# Patient Record
Sex: Female | Born: 1995 | Race: White | Hispanic: No | Marital: Single | Smoking: Never smoker
Health system: Southern US, Community
[De-identification: ages and names within clinical notes are randomized; demographics above are authoritative.]

## PROBLEM LIST (undated history)

## (undated) DIAGNOSIS — N3010 Interstitial cystitis (chronic) without hematuria: Secondary | ICD-10-CM

## (undated) DIAGNOSIS — F909 Attention-deficit hyperactivity disorder, unspecified type: Secondary | ICD-10-CM

## (undated) HISTORY — PX: WISDOM TOOTH EXTRACTION: SHX21

## (undated) HISTORY — DX: Interstitial cystitis (chronic) without hematuria: N30.10

---
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
## 2015-01-16 ENCOUNTER — Ambulatory Visit (INDEPENDENT_AMBULATORY_CARE_PROVIDER_SITE_OTHER): Payer: PRIVATE HEALTH INSURANCE | Admitting: Obstetrics and Gynecology

## 2015-01-16 ENCOUNTER — Encounter: Payer: Self-pay | Admitting: Obstetrics and Gynecology

## 2015-01-16 VITALS — BP 118/62 | HR 64 | Resp 14 | Wt 152.0 lb

## 2015-01-16 DIAGNOSIS — Z30431 Encounter for routine checking of intrauterine contraceptive device: Secondary | ICD-10-CM | POA: Diagnosis not present

## 2015-01-16 NOTE — Progress Notes (Signed)
Patient ID: Dawn Webb, female   DOB: 13-Jul-1995, 19 y.o.   MRN: [id number] GYNECOLOGY  VISIT   HPI: 19 y.o.   Single  Caucasian  female   G0P0 with Patient's last menstrual period was 01/11/2015.   here for 1 month f/u skyla IUD insertion. Patient is doing well with IUD.   Light bleeding and cramping for the first week and a half. She has had a cycle, it was lighter and less crampy than normal. No other concerns.   GYNECOLOGIC HISTORY: Patient's last menstrual period was 01/11/2015. Contraception:IUD Menopausal hormone therapy: N/A        OB History    Gravida Para Term Preterm AB TAB SAB Ectopic Multiple Living   0                  There are no active problems to display for this patient.   History reviewed. No pertinent past medical history.  Past Surgical History  Procedure Laterality Date  . Tonsillectomy  2013    Current Outpatient Prescriptions  Medication Sig Dispense Refill  . Levonorgestrel 13.5 MG IUD 13.5 mg by Intrauterine route once. 1 Intra Uterine Device 0   No current facility-administered medications for this visit.     ALLERGIES: Review of patient's allergies indicates no known allergies.  Family History  Problem Relation Age of Onset  . Lung cancer Maternal Grandfather     worked in [employer]    Social History   Social History  . Marital Status: Single    Spouse Name: N/A  . Number of Children: N/A  . Years of Education: N/A   Occupational History  . Not on file.   Social History Main Topics  . Smoking status: Never Smoker   . Smokeless tobacco: Never Used  . Alcohol Use: No  . Drug Use: No  . Sexual Activity:    Partners: Male    Birth Control/ Protection: Pill, Condom   Other Topics Concern  . Not on file   Social History Narrative    Review of Systems  All other systems reviewed and are negative.   PHYSICAL EXAMINATION:    BP 118/62 mmHg  Pulse 64  Resp 14  Wt 152 lb (68.947 kg)  LMP 01/11/2015    General  appearance: alert, cooperative and appears stated age  Pelvic: External genitalia:  no lesions              Urethra:  normal appearing urethra with no masses, tenderness or lesions              Bartholins and Skenes: normal                 Vagina: normal appearing vagina with normal color and discharge, no lesions              Cervix: no lesions and IUD strings 2 cm.              Bimanual Exam:  Uterus:  normal size, contour, position, consistency, mobility, non-tender and anteverted              Adnexa: no mass, fullness, tenderness             Chaperone was present for exam.  ASSESSMENT IUD check, doing well    PLAN Routine f/u   An After Visit Summary was printed and given to the patient.  ______ minutes face to face time of which over 50% was spent in counseling.

## 2015-02-24 ENCOUNTER — Telehealth: Payer: Self-pay | Admitting: Obstetrics and Gynecology

## 2015-02-24 NOTE — Telephone Encounter (Signed)
Spoke with patient's mother Dawn Webb. Sheryl states that the patient has been having increased urinary frequency and discomfort with urination. [facility] and was told she had a UTI. Completed antibiotics and still has symptoms. [facility] and was advised that she does not have a UTI currently. Does not have any fevers, chills, or lower back pain. Will be in town on 10/28 and would like to see an MD. Advised mother that Dr.Jertson is out of the office on Friday. Appointment scheduled for 10/28 at 1 pm with Dr.Silva. Agreeable to date and time. Advised if patient's symptoms persist or develops fever, chills, or lower back pain she will need to be seen again at school or a local urgent care. Mother is agreeable and will advise patient.  Cc: Dr.Jertson  Routing to provider for final review. Patient agreeable to disposition. Will close encounter.

## 2015-02-24 NOTE — Telephone Encounter (Signed)
Patient mother calling in regards to patient, patient had IUD inserted 12/08/2014. Patient's mother Dawn Webb says ever since patient has had IUD she has been having uti symptoms, [facility] and they have done tests on her and can't find anything wrong. She is still having urinary symptoms and would like patient to be seen by Dr. Oscar LaJertson. Patient's mom sheryl says she will be in town 03/03/15 and would  Like patient to see Dr. Oscar LaJertson. Best contact # for [employer] [phone] (no dpr on file to speak with mom)

## 2015-03-03 ENCOUNTER — Encounter: Payer: Self-pay | Admitting: Obstetrics and Gynecology

## 2015-03-03 ENCOUNTER — Ambulatory Visit (INDEPENDENT_AMBULATORY_CARE_PROVIDER_SITE_OTHER): Payer: PRIVATE HEALTH INSURANCE | Admitting: Obstetrics and Gynecology

## 2015-03-03 VITALS — BP 126/70 | HR 60 | Resp 14 | Wt 145.0 lb

## 2015-03-03 DIAGNOSIS — N941 Unspecified dyspareunia: Secondary | ICD-10-CM

## 2015-03-03 DIAGNOSIS — R300 Dysuria: Secondary | ICD-10-CM | POA: Diagnosis not present

## 2015-03-03 DIAGNOSIS — R8290 Unspecified abnormal findings in urine: Secondary | ICD-10-CM

## 2015-03-03 LAB — POCT URINALYSIS DIPSTICK
Bilirubin, UA: NEGATIVE
Glucose, UA: NEGATIVE
Ketones, UA: NEGATIVE
Nitrite, UA: NEGATIVE
PH UA: 6.5
Protein, UA: NEGATIVE
RBC UA: NEGATIVE
UROBILINOGEN UA: NEGATIVE

## 2015-03-03 MED ORDER — NITROFURANTOIN MONOHYD MACRO 100 MG PO CAPS: 100.0000 mg | ORAL_CAPSULE | Freq: Two times a day (BID) | ORAL | Status: DC

## 2015-03-03 NOTE — Progress Notes (Signed)
Patient ID: Dawn DressSarah L Webb, female   DOB: [birth year], 19 y.o.   MRN: [id number] GYNECOLOGY  VISIT   HPI: 19 y.o.   Single  Caucasian  female   G0P0 with Patient's last menstrual period was 02/06/2015.   here c/o dysuria, urinary frequency and urgency. She just finished 7 days of doxycyline.  Almost feels like she is leaking urine.   Symptoms onset 3 weeks ago.  [facility].  Received Rx for doxycycline and prior to that, Ciprofloxacin.  Pain improves with voiding.  Doxycycline has helped the pain.  All culture have been negative to date.   UTIs in September and October, and then last April.   Having pain with intercourse.  Has bladder symptoms onset after intercourse.   Skyla IUD placed 12/08/14. [facility]  Had negative GC/CT in June 2016.   Urine - trace WBCs.   GYNECOLOGIC HISTORY: Patient's last menstrual period was 02/06/2015. Contraception:IUD Menopausal hormone therapy: N/A Last mammogram: N/A Last pap smear: N/A        OB History    Gravida Para Term Preterm AB TAB SAB Ectopic Multiple Living   0                  There are no active problems to display for this patient.   No past medical history on file.  Past Surgical History  Procedure Laterality Date  . Tonsillectomy  2013    Current Outpatient Prescriptions  Medication Sig Dispense Refill  . Levonorgestrel 13.5 MG IUD 13.5 mg by Intrauterine route once. 1 Intra Uterine Device 0   No current facility-administered medications for this visit.     ALLERGIES: Review of patient's allergies indicates no known allergies.  Family History  Problem Relation Age of Onset  . Lung cancer Maternal Grandfather     worked in [employer]    Social History   Social History  . Marital Status: Single    Spouse Name: N/A  . Number of Children: N/A  . Years of Education: N/A   Occupational History  . Not on file.   Social History Main Topics  . Smoking status: Never Smoker   .  Smokeless tobacco: Never Used  . Alcohol Use: No  . Drug Use: No  . Sexual Activity:    Partners: Male    Birth Control/ Protection: Pill, Condom   Other Topics Concern  . Not on file   Social History Narrative    ROS:  Pertinent items are noted in HPI.  PHYSICAL EXAMINATION:    BP 126/70 mmHg  Pulse 60  Resp 14  Wt 145 lb (65.772 kg)  LMP 02/06/2015    General appearance: alert, cooperative and appears stated age   Pelvic: External genitalia:  no lesions              Urethra:  normal appearing urethra with no masses, tenderness or lesions              Bartholins and Skenes: normal                 Vagina: normal appearing vagina with normal color and discharge, no lesions              Cervix: no lesions and IUD strings seen.  No CMT.             Bimanual Exam:  Uterus:  normal size, contour, position, consistency, mobility, non-tender  Adnexa: normal adnexa and no mass, fullness, tenderness             Chaperone was present for exam.  ASSESSMENT  Skyla IUD patient.  Dysuria.    Negative urine cultures. Status post doxycycline 100 mg pot bid for 1 week. No evidence of PID.  PLAN  Will do murine micro and culture.  GC/CT taken today.  Affirm testing.  Will treat with Macrobid 100 mg po bid for 7 days.  Pyridium (AZO standard) 200 mg pot tid for 48 hours.  If pain persists, would consider ultrasound and possible urology referral.  An After Visit Summary was printed and given to the patient.  ___15___ minutes face to face time of which over 50% was spent in counseling.

## 2015-03-04 LAB — WET PREP BY MOLECULAR PROBE
Candida species: NEGATIVE
Gardnerella vaginalis: NEGATIVE
Trichomonas vaginosis: NEGATIVE

## 2015-03-04 LAB — URINALYSIS, MICROSCOPIC ONLY
CRYSTALS: NONE SEEN [HPF]
Casts: NONE SEEN [LPF]
Yeast: NONE SEEN [HPF]

## 2015-03-04 LAB — URINE CULTURE
COLONY COUNT: NO GROWTH
Organism ID, Bacteria: NO GROWTH

## 2015-03-07 LAB — IPS N GONORRHOEA AND CHLAMYDIA BY PCR

## 2015-03-10 ENCOUNTER — Telehealth: Payer: Self-pay | Admitting: Obstetrics and Gynecology

## 2015-03-10 NOTE — Telephone Encounter (Signed)
I do not personally know of one.  There are many medical groups [facility] including the [facility] who also have satellite clinics in [place].   If she wants to see someone in [place], I would refer her to [facility].   Cc- Dr. Oscar LaJertson

## 2015-03-10 NOTE — Telephone Encounter (Signed)
Routing to Dr. Edward JollySilva for possible recommendations.

## 2015-03-10 NOTE — Telephone Encounter (Signed)
Call back to patient. She is given message from Dr. Edward JollySilva. She is going to research options in the area and return call if she needs a referral from her insurance company. [place] at this time.  Routing to provider for final review. Patient agreeable to disposition. Will close encounter.

## 2015-03-10 NOTE — Telephone Encounter (Signed)
Patient says Dr. Edward JollySilva recommended for her to see a urologist patient wanted to know if Dr. Edward JollySilva knew of one in [place]. Best # to reach [phone]

## 2015-07-24 ENCOUNTER — Telehealth: Payer: Self-pay | Admitting: Obstetrics and Gynecology

## 2015-07-24 DIAGNOSIS — Z30432 Encounter for removal of intrauterine contraceptive device: Secondary | ICD-10-CM

## 2015-07-24 NOTE — Telephone Encounter (Signed)
Patient would like to have her iud removed and to discuss getting on birth control pills.

## 2015-07-24 NOTE — Telephone Encounter (Signed)
Spoke with patient. She would like to schedule an IUD removal at this time. Patient would like to discuss other forms of birth control as well. Appointment scheduled for 07/31/2015 at 9 am with Dr.Jertson. She is agreeable to date and time. Order placed for precert.  Routing to provider for final review. Patient agreeable to disposition. Will close encounter.

## 2015-07-28 ENCOUNTER — Telehealth: Payer: Self-pay | Admitting: Obstetrics and Gynecology

## 2015-07-28 NOTE — Telephone Encounter (Signed)
Spoke with patient. Correct information obtained. Ok to close message.

## 2015-07-28 NOTE — Telephone Encounter (Signed)
Called patient to review benefits for a recommended procedure, the insurance we have on file has terminated as of 04/05/15. We will need to insurance information in order to verify/pre-cert benefits. Left Voicemail requesting a call back

## 2015-07-31 ENCOUNTER — Encounter: Payer: Self-pay | Admitting: Obstetrics and Gynecology

## 2015-07-31 ENCOUNTER — Ambulatory Visit (INDEPENDENT_AMBULATORY_CARE_PROVIDER_SITE_OTHER): Payer: No Typology Code available for payment source | Admitting: Obstetrics and Gynecology

## 2015-07-31 VITALS — BP 114/60 | HR 64 | Resp 14 | Wt 153.0 lb

## 2015-07-31 DIAGNOSIS — N946 Dysmenorrhea, unspecified: Secondary | ICD-10-CM

## 2015-07-31 DIAGNOSIS — Z3009 Encounter for other general counseling and advice on contraception: Secondary | ICD-10-CM

## 2015-07-31 DIAGNOSIS — L709 Acne, unspecified: Secondary | ICD-10-CM

## 2015-07-31 DIAGNOSIS — Z30432 Encounter for removal of intrauterine contraceptive device: Secondary | ICD-10-CM | POA: Diagnosis not present

## 2015-07-31 MED ORDER — ETONOGESTREL-ETHINYL ESTRADIOL 0.12-0.015 MG/24HR VA RING: VAGINAL_RING | VAGINAL | Status: DC

## 2015-07-31 NOTE — Patient Instructions (Signed)

## 2015-07-31 NOTE — Progress Notes (Signed)
Patient ID: Dawn Webb, female   DOB: [birth year], 20 y.o.   MRN: [id number] GYNECOLOGY  VISIT   HPI: 20 y.o.   Single  Caucasian  female   G0P0 with Patient's last menstrual period was 07/09/2015.   here to have her IUD removed and to discuss other birth control options. She has severe cramps with her cycle and her acne has got worse since having the Skyla IUD. She is having monthly cycles, lighter than prior to the IUD. Severe cramps for one day, less severe for the rest of the week. She has started getting some pre-menstrual cramping.  She was recently seen by a urologist and was diagnosed with interstitial cystitis.  She was on OCP's in the past. No contraindications. She would like to not have to take a pill every day. She is currently sexually active.   GYNECOLOGIC HISTORY: Patient's last menstrual period was 07/09/2015. Contraception:IUD Menopausal hormone therapy: none         OB History    Gravida Para Term Preterm AB TAB SAB Ectopic Multiple Living   0                  There are no active problems to display for this patient.   History reviewed. No pertinent past medical history.  Past Surgical History  Procedure Laterality Date  . Tonsillectomy  2013    Current Outpatient Prescriptions  Medication Sig Dispense Refill  . Levonorgestrel 13.5 MG IUD 13.5 mg by Intrauterine route once. 1 Intra Uterine Device 0   No current facility-administered medications for this visit.     ALLERGIES: Review of patient's allergies indicates no known allergies.  Family History  Problem Relation Age of Onset  . Lung cancer Maternal Grandfather     worked in [employer]  . Heart attack Father     Social History   Social History  . Marital Status: Single    Spouse Name: N/A  . Number of Children: N/A  . Years of Education: N/A   Occupational History  . Not on file.   Social History Main Topics  . Smoking status: Never Smoker   . Smokeless tobacco: Never Used  . Alcohol  Use: No  . Drug Use: No  . Sexual Activity:    Partners: Male    Birth Control/ Protection: Pill, Condom   Other Topics Concern  . Not on file   Social History Narrative    Review of Systems  Constitutional: Negative.   HENT: Negative.   Eyes: Negative.   Respiratory: Negative.   Cardiovascular: Negative.   Gastrointestinal: Negative.   Genitourinary: Negative.   Musculoskeletal: Negative.   Skin: Negative.   Neurological: Negative.   Endo/Heme/Allergies: Negative.   Psychiatric/Behavioral: Negative.     PHYSICAL EXAMINATION:    BP 114/60 mmHg  Pulse 64  Resp 14  Wt 153 lb (69.4 kg)  LMP 07/09/2015    General appearance: alert, cooperative and appears stated age Skin: moderate acne noted on her face Pelvic: External genitalia:  no lesions              Urethra:  normal appearing urethra with no masses, tenderness or lesions              Bartholins and Skenes: normal                 Vagina: normal appearing vagina with normal color and discharge, no lesions  Cervix: without lesions, IUD string 2 cm   The IUD was removed with ringed forceps without difficulty  Chaperone was present for exam.  ASSESSMENT Acne and increased cramping with the skyla IUD, desires removal Contraception management Dysmenorrhea    PLAN IUD removed Start the nuvaring, risks reviewed, no contraindications She will start the nuvaring today, aware of abnormal bleeding, use condoms  Acne was better on OCP's in the past, likely will improve with the nuvaring F/U in 7/17 for her annual with Mrs Berneice GandyGrubb She is taking advil for cramps, discussed the option of Anaprox, she will call if she wants it   An After Visit Summary was printed and given to the patient.

## 2015-11-10 ENCOUNTER — Ambulatory Visit: Payer: PRIVATE HEALTH INSURANCE | Admitting: Nurse Practitioner

## 2015-12-15 ENCOUNTER — Encounter: Payer: Self-pay | Admitting: Certified Nurse Midwife

## 2015-12-15 ENCOUNTER — Ambulatory Visit (INDEPENDENT_AMBULATORY_CARE_PROVIDER_SITE_OTHER): Payer: No Typology Code available for payment source | Admitting: Certified Nurse Midwife

## 2015-12-15 VITALS — BP 100/64 | HR 72 | Resp 16 | Ht 64.25 in | Wt 143.0 lb

## 2015-12-15 DIAGNOSIS — Z0000 Encounter for general adult medical examination without abnormal findings: Secondary | ICD-10-CM | POA: Diagnosis not present

## 2015-12-15 DIAGNOSIS — Z01419 Encounter for gynecological examination (general) (routine) without abnormal findings: Secondary | ICD-10-CM | POA: Diagnosis not present

## 2015-12-15 DIAGNOSIS — Z3040 Encounter for surveillance of contraceptives, unspecified: Secondary | ICD-10-CM

## 2015-12-15 MED ORDER — ETONOGESTREL-ETHINYL ESTRADIOL 0.12-0.015 MG/24HR VA RING: VAGINAL_RING | VAGINAL | 4 refills | Status: DC

## 2015-12-15 NOTE — Patient Instructions (Signed)
General topics  Next pap or exam is  due in 1 year Take a Women's multivitamin Take 1200 mg. of calcium daily - prefer dietary If any concerns in interim to call back  Breast Self-Awareness Practicing breast self-awareness may pick up problems early, prevent significant medical complications, and possibly save your life. By practicing breast self-awareness, you can become familiar with how your breasts look and feel and if your breasts are changing. This allows you to notice changes early. It can also offer you some reassurance that your breast health is good. One way to learn what is normal for your breasts and whether your breasts are changing is to do a breast self-exam. If you find a lump or something that was not present in the past, it is best to contact your caregiver right away. Other findings that should be evaluated by your caregiver include nipple discharge, especially if it is bloody; skin changes or reddening; areas where the skin seems to be pulled in (retracted); or new lumps and bumps. Breast pain is seldom associated with cancer (malignancy), but should also be evaluated by a caregiver. BREAST SELF-EXAM The best time to examine your breasts is 5 7 days after your menstrual period is over.  ExitCare Patient Information 2013 [employer].   Exercise to Stay Healthy Exercise helps you become and stay healthy. EXERCISE IDEAS AND TIPS Choose exercises that:  You enjoy.  Fit into your day. You do not need to exercise really hard to be healthy. You can do exercises at a slow or medium level and stay healthy. You can:  Stretch before and after working out.  Try yoga, Pilates, or tai chi.  Lift weights.  Walk fast, swim, jog, run, climb stairs, bicycle, dance, or rollerskate.  Take aerobic classes. Exercises that burn about 150 calories:  Running 1  miles in 15 minutes.  Playing volleyball for 45 to 60 minutes.  Washing and waxing a car for 45 to 60  minutes.  Playing touch football for 45 minutes.  Walking 1  miles in 35 minutes.  Pushing a stroller 1  miles in 30 minutes.  Playing basketball for 30 minutes.  Raking leaves for 30 minutes.  Bicycling 5 miles in 30 minutes.  Walking 2 miles in 30 minutes.  Dancing for 30 minutes.  Shoveling snow for 15 minutes.  Swimming laps for 20 minutes.  Walking up stairs for 15 minutes.  Bicycling 4 miles in 15 minutes.  Gardening for 30 to 45 minutes.  Jumping rope for 15 minutes.  Washing windows or floors for 45 to 60 minutes. Document Released: 05/25/2010 Document Revised: 07/15/2011 Document Reviewed: 05/25/2010 ExitCare Patient Information 2013 [employer].   Other topics ( that may be useful information):    Sexually Transmitted Disease Sexually transmitted disease (STD) refers to any infection that is passed from person to person during sexual activity. This may happen by way of saliva, semen, blood, vaginal mucus, or urine. Common STDs include:  Gonorrhea.  Chlamydia.  Syphilis.  HIV/AIDS.  Genital herpes.  Hepatitis B and C.  Trichomonas.  Human papillomavirus (HPV).  Pubic lice. CAUSES  An STD may be spread by bacteria, virus, or parasite. A person can get an STD by:  Sexual intercourse with an infected person.  Sharing sex toys with an infected person.  Sharing needles with an infected person.  Having intimate contact with the genitals, mouth, or rectal areas of an infected person. SYMPTOMS  Some people may not have any symptoms, but   they can still pass the infection to others. Different STDs have different symptoms. Symptoms include:  Painful or bloody urination.  Pain in the pelvis, abdomen, vagina, anus, throat, or eyes.  Skin rash, itching, irritation, growths, or sores (lesions). These usually occur in the genital or anal area.  Abnormal vaginal discharge.  Penile discharge in men.  Soft, flesh-colored skin growths in the  genital or anal area.  Fever.  Pain or bleeding during sexual intercourse.  Swollen glands in the groin area.  Yellow skin and eyes (jaundice). This is seen with hepatitis. DIAGNOSIS  To make a diagnosis, your caregiver may:  Take a medical history.  Perform a physical exam.  Take a specimen (culture) to be examined.  Examine a sample of discharge under a microscope.  Perform blood test TREATMENT   Chlamydia, gonorrhea, trichomonas, and syphilis can be cured with antibiotic medicine.  Genital herpes, hepatitis, and HIV can be treated, but not cured, with prescribed medicines. The medicines will lessen the symptoms.  Genital warts from HPV can be treated with medicine or by freezing, burning (electrocautery), or surgery. Warts may come back.  HPV is a virus and cannot be cured with medicine or surgery.However, abnormal areas may be followed very closely by your caregiver and may be removed from the cervix, vagina, or vulva through office procedures or surgery. If your diagnosis is confirmed, your recent sexual partners need treatment. This is true even if they are symptom-free or have a negative culture or evaluation. They should not have sex until their caregiver says it is okay. HOME CARE INSTRUCTIONS  All sexual partners should be informed, tested, and treated for all STDs.  Take your antibiotics as directed. Finish them even if you start to feel better.  Only take over-the-counter or prescription medicines for pain, discomfort, or fever as directed by your caregiver.  Rest.  Eat a balanced diet and drink enough fluids to keep your urine clear or pale yellow.  Do not have sex until treatment is completed and you have followed up with your caregiver. STDs should be checked after treatment.  Keep all follow-up appointments, Pap tests, and blood tests as directed by your caregiver.  Only use latex condoms and water-soluble lubricants during sexual activity. Do not use  petroleum jelly or oils.  Avoid alcohol and illegal drugs.  Get vaccinated for HPV and hepatitis. If you have not received these vaccines in the past, talk to your caregiver about whether one or both might be right for you.  Avoid risky sex practices that can break the skin. The only way to avoid getting an STD is to avoid all sexual activity.Latex condoms and dental dams (for oral sex) will help lessen the risk of getting an STD, but will not completely eliminate the risk. SEEK MEDICAL CARE IF:   You have a fever.  You have any new or worsening symptoms. Document Released: 07/13/2002 Document Revised: 07/15/2011 Document Reviewed: 07/20/2010 [facility] Patient Information 2013 [employer].    Domestic Abuse You are being battered or abused if someone close to you hits, pushes, or physically hurts you in any way. You also are being abused if you are forced into activities. You are being sexually abused if you are forced to have sexual contact of any kind. You are being emotionally abused if you are made to feel worthless or if you are constantly threatened. It is important to remember that help is available. No one has the right to abuse you. PREVENTION OF FURTHER  ABUSE  Learn the warning signs of danger. This varies with situations but may include: the use of alcohol, threats, isolation from friends and family, or forced sexual contact. Leave if you feel that violence is going to occur.  If you are attacked or beaten, report it to the police so the abuse is documented. You do not have to press charges. The police can protect you while you or the attackers are leaving. Get the officer's name and badge number and a copy of the report.  Find someone you can trust and tell them what is happening to you: your caregiver, a nurse, clergy member, close friend or family member. Feeling ashamed is natural, but remember that you have done nothing wrong. No one deserves abuse. Document Released:  04/19/2000 Document Revised: 07/15/2011 Document Reviewed: 06/28/2010 ExitCare Patient Information 2013 [employer].    How Much is Too Much Alcohol? Drinking too much alcohol can cause injury, accidents, and health problems. These types of problems can include:   Car crashes.  Falls.  Family fighting (domestic violence).  Drowning.  Fights.  Injuries.  Burns.  Damage to certain organs.  Having a baby with birth defects. ONE DRINK CAN BE TOO MUCH WHEN YOU ARE:  Working.  Pregnant or breastfeeding.  Taking medicines. Ask your doctor.  Driving or planning to drive. If you or someone you know has a drinking problem, get help from a doctor.  Document Released: 02/16/2009 Document Revised: 07/15/2011 Document Reviewed: 02/16/2009 ExitCare Patient Information 2013 [employer].   Smoking Hazards Smoking cigarettes is extremely bad for your health. Tobacco smoke has over 200 known poisons in it. There are over 60 chemicals in tobacco smoke that cause cancer. Some of the chemicals found in cigarette smoke include:   Cyanide.  Benzene.  Formaldehyde.  Methanol (wood alcohol).  Acetylene (fuel used in welding torches).  Ammonia. Cigarette smoke also contains the poisonous gases nitrogen oxide and carbon monoxide.  Cigarette smokers have an increased risk of many serious medical problems and Smoking causes approximately:  90% of all lung cancer deaths in men.  80% of all lung cancer deaths in women.  90% of deaths from chronic obstructive lung disease. Compared with nonsmokers, smoking increases the risk of:  Coronary heart disease by 2 to 4 times.  Stroke by 2 to 4 times.  Men developing lung cancer by 23 times.  Women developing lung cancer by 13 times.  Dying from chronic obstructive lung diseases by 12 times.  . Smoking is the most preventable cause of death and disease in our society.  WHY IS SMOKING ADDICTIVE?  Nicotine is the chemical  agent in tobacco that is capable of causing addiction or dependence.  When you smoke and inhale, nicotine is absorbed rapidly into the bloodstream through your lungs. Nicotine absorbed through the lungs is capable of creating a powerful addiction. Both inhaled and non-inhaled nicotine may be addictive.  Addiction studies of cigarettes and spit tobacco show that addiction to nicotine occurs mainly during the teen years, when young people begin using tobacco products. WHAT ARE THE BENEFITS OF QUITTING?  There are many health benefits to quitting smoking.   Likelihood of developing cancer and heart disease decreases. Health improvements are seen almost immediately.  Blood pressure, pulse rate, and breathing patterns start returning to normal soon after quitting. QUITTING SMOKING   American Lung Association - 1-800-LUNGUSA  American Cancer Society - 1-800-ACS-2345 Document Released: 05/30/2004 Document Revised: 07/15/2011 Document Reviewed: 02/01/2009 ExitCare Patient Information 2013 ExitCare,   LLC.   Stress Management Stress is a state of physical or mental tension that often results from changes in your life or normal routine. Some common causes of stress are:  Death of a loved one.  Injuries or severe illnesses.  Getting fired or changing jobs.  Moving into a new home. Other causes may be:  Sexual problems.  Business or financial losses.  Taking on a large debt.  Regular conflict with someone at home or at work.  Constant tiredness from lack of sleep. It is not just bad things that are stressful. It may be stressful to:  Win the lottery.  Get married.  Buy a new car. The amount of stress that can be easily tolerated varies from person to person. Changes generally cause stress, regardless of the types of change. Too much stress can affect your health. It may lead to physical or emotional problems. Too little stress (boredom) may also become stressful. SUGGESTIONS TO  REDUCE STRESS:  Talk things over with your family and friends. It often is helpful to share your concerns and worries. If you feel your problem is serious, you may want to get help from a professional counselor.  Consider your problems one at a time instead of lumping them all together. Trying to take care of everything at once may seem impossible. List all the things you need to do and then start with the most important one. Set a goal to accomplish 2 or 3 things each day. If you expect to do too many in a single day you will naturally fail, causing you to feel even more stressed.  Do not use alcohol or drugs to relieve stress. Although you may feel better for a short time, they do not remove the problems that caused the stress. They can also be habit forming.  Exercise regularly - at least 3 times per week. Physical exercise can help to relieve that "uptight" feeling and will relax you.  The shortest distance between despair and hope is often a good night's sleep.  Go to bed and get up on time allowing yourself time for appointments without being rushed.  Take a short "time-out" period from any stressful situation that occurs during the day. Close your eyes and take some deep breaths. Starting with the muscles in your face, tense them, hold it for a few seconds, then relax. Repeat this with the muscles in your neck, shoulders, hand, stomach, back and legs.  Take good care of yourself. Eat a balanced diet and get plenty of rest.  Schedule time for having fun. Take a break from your daily routine to relax. HOME CARE INSTRUCTIONS   Call if you feel overwhelmed by your problems and feel you can no longer manage them on your own.  Return immediately if you feel like hurting yourself or someone else. Document Released: 10/16/2000 Document Revised: 07/15/2011 Document Reviewed: 06/08/2007 ExitCare Patient Information 2013 ExitCare[employer].   

## 2015-12-15 NOTE — Progress Notes (Signed)
20 y.o. G0P0 Single  Caucasian Fe here for annual exam. Periods normal. Nuvaring working well. Partner change, desires STD screening. Diagnosed IC controlling with diet.Worked all summer, now going back to school at [employer]! No other health concern today.    Patient's last menstrual period was 09/26/2015.          Sexually active: Yes.     The current method of family planning is NuvaRing vaginal inserts.    Exercising: Yes.    running & weights Smoker:  no  Health Maintenance: Pap:  none MMG:  none Colonoscopy:  none BMD:   none TDaP:  Prior to college Shingles: no Pneumonia: no Hep C and HIV: HIV neg 2016 Labs: none Self breast exam: done occ   reports that she has never smoked. She has never used smokeless tobacco. She reports that she does not drink alcohol or use drugs.  History reviewed. No pertinent past medical history.  Past Surgical History:  Procedure Laterality Date  . TONSILLECTOMY  2013    Current Outpatient Prescriptions  Medication Sig Dispense Refill  . etonogestrel-ethinyl estradiol (NUVARING) 0.12-0.015 MG/24HR vaginal ring Insert vaginally and leave in place for 3 consecutive weeks, then remove for 1 week. 3 each 1   No current facility-administered medications for this visit.     Family History  Problem Relation Age of Onset  . Lung cancer Maternal Grandfather     worked in [employer]  . Heart attack Father     ROS:  Pertinent items are noted in HPI.  Otherwise, a comprehensive ROS was negative.  Exam:   BP 100/64   Pulse 72   Resp 16   Ht 5' 4.25" (1.632 m)   Wt 143 lb (64.9 kg)   LMP 09/26/2015 Comment: used continuously (nuvaring)  BMI 24.36 kg/m  Height: 5' 4.25" (163.2 cm) Ht Readings from Last 3 Encounters:  12/15/15 5' 4.25" (1.632 m)  11/03/14  (1.626 m) (46 %, Z= -0.11)*   * Growth percentiles are based on CDC 2-20 Years data.    General appearance: alert, cooperative and appears stated age Head: Normocephalic, without  obvious abnormality, atraumatic Neck: no adenopathy, supple, symmetrical, trachea midline and thyroid normal to inspection and palpation Lungs: clear to auscultation bilaterally Breasts: normal appearance, no masses or tenderness, No nipple retraction or dimpling, No nipple discharge or bleeding, No axillary or supraclavicular adenopathy Heart: regular rate and rhythm Abdomen: soft, non-tender; no masses,  no organomegaly Extremities: extremities normal, atraumatic, no cyanosis or edema Skin: Skin color, texture, turgor normal. No rashes or lesions Lymph nodes: Cervical, supraclavicular, and axillary nodes normal. No abnormal inguinal nodes palpated Neurologic: Grossly normal   Pelvic: External genitalia:  no lesions              Urethra:  normal appearing urethra with no masses, tenderness or lesions              Bartholin's and Skene's: normal                 Vagina: normal appearing vagina with normal color and discharge, no lesions              Cervix: no cervical motion tenderness, no lesions and nulliparous appearance              Pap taken: No. Bimanual Exam:  Uterus:  normal size, contour, position, consistency, mobility, non-tender              Adnexa: normal adnexa  and no mass, fullness, tenderness               Rectovaginal: Confirms               Anus:  Normal appearance  Chaperone present: yes  A:  Well Woman with normal exam  Contraception Nuvaring desired  New diagnosis of IC, treating with diet with good results, Urology management  STD screening  P:   Reviewed health and wellness pertinent to exam  Rx Nuraring see order with instructions Risks and benefits given, warning signs reviewed and need to advise if occurs.  Continue follow up as indicated  Lab: GC,chlamydia, Affirm only  Pap smear as above at age 20   counseled on breast self exam, STD prevention, HIV risk factors and prevention, adequate intake of calcium and vitamin D, diet and exercise  return  annually or prn  An After Visit Summary was printed and given to the patient.

## 2015-12-16 LAB — WET PREP BY MOLECULAR PROBE
CANDIDA SPECIES: NEGATIVE
Gardnerella vaginalis: NEGATIVE
TRICHOMONAS VAG: NEGATIVE

## 2015-12-19 LAB — IPS N GONORRHOEA AND CHLAMYDIA BY PCR

## 2015-12-21 NOTE — Progress Notes (Signed)
Encounter reviewed Jill Jertson, MD   

## 2016-12-17 ENCOUNTER — Ambulatory Visit: Payer: No Typology Code available for payment source | Admitting: Certified Nurse Midwife

## 2016-12-18 ENCOUNTER — Ambulatory Visit (INDEPENDENT_AMBULATORY_CARE_PROVIDER_SITE_OTHER): Payer: No Typology Code available for payment source | Admitting: Obstetrics and Gynecology

## 2016-12-18 ENCOUNTER — Other Ambulatory Visit (HOSPITAL_COMMUNITY)
Admission: RE | Admit: 2016-12-18 | Discharge: 2016-12-18 | Disposition: A | Discharge status: Home / Self Care | Payer: No Typology Code available for payment source | Source: Ambulatory Visit | Attending: Obstetrics and Gynecology | Admitting: Obstetrics and Gynecology

## 2016-12-18 ENCOUNTER — Encounter: Payer: Self-pay | Admitting: Obstetrics and Gynecology

## 2016-12-18 VITALS — BP 108/62 | HR 88 | Resp 16 | Ht 63.5 in | Wt 158.0 lb

## 2016-12-18 DIAGNOSIS — Z3044 Encounter for surveillance of vaginal ring hormonal contraceptive device: Secondary | ICD-10-CM | POA: Diagnosis not present

## 2016-12-18 DIAGNOSIS — Z113 Encounter for screening for infections with a predominantly sexual mode of transmission: Secondary | ICD-10-CM

## 2016-12-18 DIAGNOSIS — Z124 Encounter for screening for malignant neoplasm of cervix: Secondary | ICD-10-CM | POA: Insufficient documentation

## 2016-12-18 DIAGNOSIS — Z0000 Encounter for general adult medical examination without abnormal findings: Secondary | ICD-10-CM | POA: Diagnosis not present

## 2016-12-18 DIAGNOSIS — Z01419 Encounter for gynecological examination (general) (routine) without abnormal findings: Secondary | ICD-10-CM | POA: Diagnosis not present

## 2016-12-18 MED ORDER — ETONOGESTREL-ETHINYL ESTRADIOL 0.12-0.015 MG/24HR VA RING: VAGINAL_RING | VAGINAL | 4 refills | Status: DC

## 2016-12-18 NOTE — Progress Notes (Signed)
21 y.o. G0P0 SingleCaucasianF here for annual exam.  Sexually active, no currently active. Doesn't use condoms.  Period Cycle (Days):  (every 3 months due to NuvaRing) Period Duration (Days): 4-5 Period Pattern: Regular Menstrual Flow: Light Menstrual Control: Tampon Menstrual Control Change Freq (Hours): 6 Dysmenorrhea: (!) Mild Dysmenorrhea Symptoms: Cramping, Nausea  Patient's last menstrual period was 12/17/2016.          Sexually active: Yes.    The current method of family planning is NuvaRing vaginal inserts.    Exercising: Yes.    hot yoga, running Smoker:  no  Health Maintenance: Pap:  none History of abnormal Pap:  n/a MMG:  none Colonoscopy:  none BMD:   none TDaP:  Prior to college Gardasil: completed series   reports that she has never smoked. She has never used smokeless tobacco. She reports that she does not drink alcohol or use drugs. 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 Plans to go to med school in a couple of years.   Past Medical History:  Diagnosis Date  . IC (interstitial cystitis)   IC flares with her cycle and with certain foods, overall tolerable.   Past Surgical History:  Procedure Laterality Date  . TONSILLECTOMY  2013  . WISDOM TOOTH EXTRACTION      Current Outpatient Prescriptions  Medication Sig Dispense Refill  . etonogestrel-ethinyl estradiol (NUVARING) 0.12-0.015 MG/24HR vaginal ring Insert vaginally and leave in place for 3 consecutive weeks, then remove for 1 week. 3 each 4   No current facility-administered medications for this visit.     Family History  Problem Relation Age of Onset  . Lung cancer Maternal Grandfather        worked in [employer]  . Heart attack Father   Father was 27 with his MI, doing well now. Right before the MI he lost 60 lbs.   Review of Systems  All other systems reviewed and are negative.   Exam:   BP 108/62 (BP Location: Right Arm, Patient Position: Sitting,  Cuff Size: Normal)   Pulse 88   Resp 16   Ht 5' 3.5" (1.613 m)   Wt 158 lb (71.7 kg)   LMP 12/17/2016   BMI 27.55 kg/m   Weight change: @WEIGHTCHANGE @ Height:   Height: 5' 3.5" (161.3 cm)  Ht Readings from Last 3 Encounters:  12/18/16 5' 3.5" (1.613 m)  12/15/15 5' 4.25" (1.632 m)  11/03/14 5\' 4"  (1.626 m) (46 %, Z= -0.11)*   * Growth percentiles are based on CDC 2-20 Years data.    General appearance: alert, cooperative and appears stated age Head: Normocephalic, without obvious abnormality, atraumatic Neck: no adenopathy, supple, symmetrical, trachea midline and thyroid normal to inspection and palpation Lungs: clear to auscultation bilaterally Cardiovascular: regular rate and rhythm Breasts: normal appearance, no masses or tenderness Abdomen: soft, non-tender; bowel sounds normal; no masses,  no organomegaly Extremities: extremities normal, atraumatic, no cyanosis or edema Skin: Skin color, texture, turgor normal. No rashes or lesions Lymph nodes: Cervical, supraclavicular, and axillary nodes normal. No abnormal inguinal nodes palpated Neurologic: Grossly normal   Pelvic: External genitalia:  no lesions              Urethra:  normal appearing urethra with no masses, tenderness or lesions              Bartholins and Skenes: normal                 Vagina:  normal appearing vagina with normal color and discharge, no lesions              Cervix: no lesions               Bimanual Exam:  Uterus:  normal size, contour, position, consistency, mobility, non-tender and anteverted              Adnexa: no mass, fullness, tenderness               Rectovaginal: Confirms               Anus:  normal sphincter tone, no lesions  Chaperone was present for exam.  A:  Well Woman with normal exam  P:   Pap   STD testing   Screening labs  Discussed breast self awareness, calcium and vit  Continue the nuvaring  Condom use encouraged

## 2016-12-18 NOTE — Patient Instructions (Signed)
EXERCISE AND DIET:  We recommended that you start or continue a regular exercise program for good health. Regular exercise means any activity that makes your heart beat faster and makes you sweat.  We recommend exercising at least 30 minutes per day at least 3 days a week, preferably 4 or 5.  We also recommend a diet low in fat and sugar.  Inactivity, poor dietary choices and obesity can cause diabetes, heart attack, stroke, and kidney damage, among others.    ALCOHOL AND SMOKING:  Women should limit their alcohol intake to no more than 7 drinks/beers/glasses of wine (combined, not each!) per week. Moderation of alcohol intake to this level decreases your risk of breast cancer and liver damage. And of course, no recreational drugs are part of a healthy lifestyle.  And absolutely no smoking or even second hand smoke. Most people know smoking can cause heart and lung diseases, but did you know it also contributes to weakening of your bones? Aging of your skin?  Yellowing of your teeth and nails?  CALCIUM AND VITAMIN D:  Adequate intake of calcium and Vitamin D are recommended.  The recommendations for exact amounts of these supplements seem to change often, but generally speaking 600 mg of calcium (either carbonate or citrate) and 800 units of Vitamin D per day seems prudent. Certain women may benefit from higher intake of Vitamin D.  If you are among these women, your doctor will have told you during your visit.    PAP SMEARS:  Pap smears, to check for cervical cancer or precancers,  have traditionally been done yearly, although recent scientific advances have shown that most women can have pap smears less often.  However, every woman still should have a physical exam from her gynecologist every year. It will include a breast check, inspection of the vulva and vagina to check for abnormal growths or skin changes, a visual exam of the cervix, and then an exam to evaluate the size and shape of the uterus and  ovaries.  And after 21 years of age, a rectal exam is indicated to check for rectal cancers. We will also provide age appropriate advice regarding health maintenance, like when you should have certain vaccines, screening for sexually transmitted diseases, bone density testing, colonoscopy, mammograms, etc.   MAMMOGRAMS:  All women over 40 years old should have a yearly mammogram. Many facilities now offer a "3D" mammogram, which may cost around $50 extra out of pocket. If possible,  we recommend you accept the option to have the 3D mammogram performed.  It both reduces the number of women who will be called back for extra views which then turn out to be normal, and it is better than the routine mammogram at detecting truly abnormal areas.    COLONOSCOPY:  Colonoscopy to screen for colon cancer is recommended for all women at age 50.  We know, you hate the idea of the prep.  We agree, BUT, having colon cancer and not knowing it is worse!!  Colon cancer so often starts as a polyp that can be seen and removed at colonscopy, which can quite literally save your life!  And if your first colonoscopy is normal and you have no family history of colon cancer, most women don't have to have it again for 10 years.  Once every ten years, you can do something that may end up saving your life, right?  We will be happy to help you get it scheduled when you are ready.    Be sure to check your insurance coverage so you understand how much it will cost.  It may be covered as a preventative service at no cost, but you should check your particular policy.      Breast Self-Awareness Breast self-awareness means being familiar with how your breasts look and feel. It involves checking your breasts regularly and reporting any changes to your health care provider. Practicing breast self-awareness is important. A change in your breasts can be a sign of a serious medical problem. Being familiar with how your breasts look and feel allows  you to find any problems early, when treatment is more likely to be successful. All women should practice breast self-awareness, including women who have had breast implants. How to do a breast self-exam One way to learn what is normal for your breasts and whether your breasts are changing is to do a breast self-exam. To do a breast self-exam: Look for Changes  1. Remove all the clothing above your waist. 2. Stand in front of a mirror in a room with good lighting. 3. Put your hands on your hips. 4. Push your hands firmly downward. 5. Compare your breasts in the mirror. Look for differences between them (asymmetry), such as: ? Differences in shape. ? Differences in size. ? Puckers, dips, and bumps in one breast and not the other. 6. Look at each breast for changes in your skin, such as: ? Redness. ? Scaly areas. 7. Look for changes in your nipples, such as: ? Discharge. ? Bleeding. ? Dimpling. ? Redness. ? A change in position. Feel for Changes  Carefully feel your breasts for lumps and changes. It is best to do this while lying on your back on the floor and again while sitting or standing in the shower or tub with soapy water on your skin. Feel each breast in the following way:  Place the arm on the side of the breast you are examining above your head.  Feel your breast with the other hand.  Start in the nipple area and make  inch (2 cm) overlapping circles to feel your breast. Use the pads of your three middle fingers to do this. Apply light pressure, then medium pressure, then firm pressure. The light pressure will allow you to feel the tissue closest to the skin. The medium pressure will allow you to feel the tissue that is a little deeper. The firm pressure will allow you to feel the tissue close to the ribs.  Continue the overlapping circles, moving downward over the breast until you feel your ribs below your breast.  Move one finger-width toward the center of the body.  Continue to use the  inch (2 cm) overlapping circles to feel your breast as you move slowly up toward your collarbone.  Continue the up and down exam using all three pressures until you reach your armpit.  Write Down What You Find  Write down what is normal for each breast and any changes that you find. Keep a written record with breast changes or normal findings for each breast. By writing this information down, you do not need to depend only on memory for size, tenderness, or location. Write down where you are in your menstrual cycle, if you are still menstruating. If you are having trouble noticing differences in your breasts, do not get discouraged. With time you will become more familiar with the variations in your breasts and more comfortable with the exam. How often should I examine my breasts? Examine   your breasts every month. If you are breastfeeding, the best time to examine your breasts is after a feeding or after using a breast pump. If you menstruate, the best time to examine your breasts is 5-7 days after your period is over. During your period, your breasts are lumpier, and it may be more difficult to notice changes. When should I see my health care provider? See your health care provider if you notice:  A change in shape or size of your breasts or nipples.  A change in the skin of your breast or nipples, such as a reddened or scaly area.  Unusual discharge from your nipples.  A lump or thick area that was not there before.  Pain in your breasts.  Anything that concerns you.  This information is not intended to replace advice given to you by your health care provider. Make sure you discuss any questions you have with your health care provider. Document Released: 04/22/2005 Document Revised: 09/28/2015 Document Reviewed: 03/12/2015 Elsevier Interactive Patient Education  2018 [employer] Inc[employer]  

## 2016-12-19 LAB — HEP, RPR, HIV PANEL
HEP B S AG: NEGATIVE
HIV SCREEN 4TH GENERATION: NONREACTIVE
RPR: NONREACTIVE

## 2016-12-19 LAB — COMPREHENSIVE METABOLIC PANEL
A/G RATIO: 1.8 (ref 1.2–2.2)
ALK PHOS: 51 IU/L (ref 39–117)
ALT: 10 IU/L (ref 0–32)
AST: 13 IU/L (ref 0–40)
Albumin: 4.4 g/dL (ref 3.5–5.5)
BUN / CREAT RATIO: 17 (ref 9–23)
BUN: 10 mg/dL (ref 6–20)
Bilirubin Total: <0.2 mg/dL (ref 0.0–1.2)
CO2: 23 mmol/L (ref 20–29)
CREATININE: 0.6 mg/dL (ref 0.57–1.00)
Calcium: 9.4 mg/dL (ref 8.7–10.2)
Chloride: 103 mmol/L (ref 96–106)
GFR, EST AFRICAN AMERICAN: 151 mL/min/{1.73_m2} (ref >59)
GFR, EST NON AFRICAN AMERICAN: 131 mL/min/{1.73_m2} (ref >59)
Globulin, Total: 2.5 g/dL (ref 1.5–4.5)
Glucose: 77 mg/dL (ref 65–99)
Potassium: 3.9 mmol/L (ref 3.5–5.2)
SODIUM: 140 mmol/L (ref 134–144)
Total Protein: 6.9 g/dL (ref 6.0–8.5)

## 2016-12-19 LAB — CBC
Hematocrit: 37 % (ref 34.0–46.6)
Hemoglobin: 12.4 g/dL (ref 11.1–15.9)
MCH: 27.6 pg (ref 26.6–33.0)
MCHC: 33.5 g/dL (ref 31.5–35.7)
MCV: 82 fL (ref 79–97)
PLATELETS: 324 10*3/uL (ref 150–379)
RBC: 4.49 x10E6/uL (ref 3.77–5.28)
RDW: 13.4 % (ref 12.3–15.4)
WBC: 7.9 10*3/uL (ref 3.4–10.8)

## 2016-12-19 LAB — LIPID PANEL
CHOL/HDL RATIO: 2.9 ratio (ref 0.0–4.4)
Cholesterol, Total: 246 mg/dL — ABNORMAL HIGH (ref 100–199)
HDL: 85 mg/dL (ref >39)
LDL CALC: 147 mg/dL — AB (ref 0–99)
Triglycerides: 69 mg/dL (ref 0–149)
VLDL CHOLESTEROL CAL: 14 mg/dL (ref 5–40)

## 2016-12-19 LAB — HEPATITIS C ANTIBODY: Hep C Virus Ab: 0.1 s/co ratio (ref 0.0–0.9)

## 2016-12-20 LAB — CYTOLOGY - PAP
CHLAMYDIA, DNA PROBE: NEGATIVE
Diagnosis: NEGATIVE
NEISSERIA GONORRHEA: NEGATIVE
Trichomonas: NEGATIVE

## 2017-12-25 ENCOUNTER — Ambulatory Visit: Payer: No Typology Code available for payment source | Admitting: Obstetrics and Gynecology

## 2018-02-14 ENCOUNTER — Other Ambulatory Visit: Payer: Self-pay | Admitting: Obstetrics and Gynecology

## 2018-03-12 ENCOUNTER — Other Ambulatory Visit: Payer: Self-pay | Admitting: Obstetrics and Gynecology

## 2019-07-19 ENCOUNTER — Encounter: Payer: Self-pay | Admitting: Certified Nurse Midwife

## 2019-07-21 ENCOUNTER — Encounter: Payer: Self-pay | Admitting: Certified Nurse Midwife

## 2019-09-29 ENCOUNTER — Emergency Department (HOSPITAL_BASED_OUTPATIENT_CLINIC_OR_DEPARTMENT_OTHER): Payer: 59

## 2019-09-29 ENCOUNTER — Emergency Department (HOSPITAL_BASED_OUTPATIENT_CLINIC_OR_DEPARTMENT_OTHER)
Admission: EM | Admit: 2019-09-29 | Discharge: 2019-09-29 | Disposition: A | Discharge status: Home / Self Care | Payer: 59 | Attending: Emergency Medicine | Admitting: Emergency Medicine

## 2019-09-29 ENCOUNTER — Other Ambulatory Visit: Payer: Self-pay

## 2019-09-29 ENCOUNTER — Encounter (HOSPITAL_BASED_OUTPATIENT_CLINIC_OR_DEPARTMENT_OTHER): Payer: Self-pay | Admitting: Emergency Medicine

## 2019-09-29 DIAGNOSIS — R110 Nausea: Secondary | ICD-10-CM | POA: Diagnosis not present

## 2019-09-29 DIAGNOSIS — R1030 Lower abdominal pain, unspecified: Secondary | ICD-10-CM | POA: Diagnosis not present

## 2019-09-29 DIAGNOSIS — R1010 Upper abdominal pain, unspecified: Secondary | ICD-10-CM | POA: Diagnosis present

## 2019-09-29 DIAGNOSIS — Z20822 Contact with and (suspected) exposure to covid-19: Secondary | ICD-10-CM | POA: Diagnosis not present

## 2019-09-29 DIAGNOSIS — A084 Viral intestinal infection, unspecified: Secondary | ICD-10-CM | POA: Insufficient documentation

## 2019-09-29 DIAGNOSIS — R109 Unspecified abdominal pain: Secondary | ICD-10-CM

## 2019-09-29 DIAGNOSIS — R1011 Right upper quadrant pain: Secondary | ICD-10-CM | POA: Insufficient documentation

## 2019-09-29 HISTORY — DX: Attention-deficit hyperactivity disorder, unspecified type: F90.9

## 2019-09-29 LAB — CBC WITH DIFFERENTIAL/PLATELET
Abs Immature Granulocytes: 0.07 10*3/uL (ref 0.00–0.07)
Basophils Absolute: 0 10*3/uL (ref 0.0–0.1)
Basophils Relative: 0 %
Eosinophils Absolute: 0 10*3/uL (ref 0.0–0.5)
Eosinophils Relative: 0 %
HCT: 36.4 % (ref 36.0–46.0)
Hemoglobin: 12.8 g/dL (ref 12.0–15.0)
Immature Granulocytes: 1 %
Lymphocytes Relative: 8 %
Lymphs Abs: 1.2 10*3/uL (ref 0.7–4.0)
MCH: 28.7 pg (ref 26.0–34.0)
MCHC: 35.2 g/dL (ref 30.0–36.0)
MCV: 81.6 fL (ref 80.0–100.0)
Monocytes Absolute: 1 10*3/uL (ref 0.1–1.0)
Monocytes Relative: 7 %
Neutro Abs: 13.2 10*3/uL — ABNORMAL HIGH (ref 1.7–7.7)
Neutrophils Relative %: 84 %
Platelets: 299 10*3/uL (ref 150–400)
RBC: 4.46 MIL/uL (ref 3.87–5.11)
RDW: 12.6 % (ref 11.5–15.5)
WBC: 15.5 10*3/uL — ABNORMAL HIGH (ref 4.0–10.5)
nRBC: 0 % (ref 0.0–0.2)

## 2019-09-29 LAB — URINALYSIS, ROUTINE W REFLEX MICROSCOPIC
Bilirubin Urine: NEGATIVE
Bilirubin Urine: NEGATIVE
Glucose, UA: NEGATIVE mg/dL
Glucose, UA: NEGATIVE mg/dL
Ketones, ur: NEGATIVE mg/dL
Ketones, ur: NEGATIVE mg/dL
Leukocytes,Ua: NEGATIVE
Nitrite: NEGATIVE
Nitrite: NEGATIVE
Protein, ur: 30 mg/dL — AB
Protein, ur: NEGATIVE mg/dL
Specific Gravity, Urine: 1.015 (ref 1.005–1.030)
Specific Gravity, Urine: 1.01 (ref 1.005–1.030)
pH: 8.5 — ABNORMAL HIGH (ref 5.0–8.0)
pH: 7.5 (ref 5.0–8.0)

## 2019-09-29 LAB — COMPREHENSIVE METABOLIC PANEL
ALT: 16 U/L (ref 0–44)
AST: 13 U/L — ABNORMAL LOW (ref 15–41)
Albumin: 3.8 g/dL (ref 3.5–5.0)
Alkaline Phosphatase: 54 U/L (ref 38–126)
Anion gap: 10 (ref 5–15)
BUN: 9 mg/dL (ref 6–20)
CO2: 23 mmol/L (ref 22–32)
Calcium: 9 mg/dL (ref 8.9–10.3)
Chloride: 102 mmol/L (ref 98–111)
Creatinine, Ser: 0.61 mg/dL (ref 0.44–1.00)
GFR calc Af Amer: >60 mL/min (ref >60)
GFR calc non Af Amer: >60 mL/min (ref >60)
Glucose, Bld: 110 mg/dL — ABNORMAL HIGH (ref 70–99)
Potassium: 3.8 mmol/L (ref 3.5–5.1)
Sodium: 135 mmol/L (ref 135–145)
Total Bilirubin: 0.6 mg/dL (ref 0.3–1.2)
Total Protein: 6.9 g/dL (ref 6.5–8.1)

## 2019-09-29 LAB — URINALYSIS, MICROSCOPIC (REFLEX)

## 2019-09-29 LAB — SARS CORONAVIRUS 2 BY RT PCR (HOSPITAL ORDER, PERFORMED IN ~~LOC~~ HOSPITAL LAB): SARS Coronavirus 2: NEGATIVE

## 2019-09-29 LAB — PREGNANCY, URINE: Preg Test, Ur: NEGATIVE

## 2019-09-29 LAB — LIPASE, BLOOD: Lipase: 16 U/L (ref 11–51)

## 2019-09-29 MED ORDER — MORPHINE SULFATE (PF) 2 MG/ML IV SOLN
2.0000 mg | Freq: Once | INTRAVENOUS | Status: AC
  Administered 2019-09-29: 2 mg via INTRAVENOUS
  Filled 2019-09-29: qty 1

## 2019-09-29 MED ORDER — MORPHINE SULFATE (PF) 4 MG/ML IV SOLN
4.0000 mg | Freq: Once | INTRAVENOUS | Status: AC
  Administered 2019-09-29: 4 mg via INTRAVENOUS
  Filled 2019-09-29: qty 1

## 2019-09-29 MED ORDER — SODIUM CHLORIDE 0.9 % IV BOLUS
500.0000 mL | Freq: Once | INTRAVENOUS | Status: AC
  Administered 2019-09-29: 500 mL via INTRAVENOUS

## 2019-09-29 MED ORDER — ONDANSETRON 4 MG PO TBDP: 4.0000 mg | ORAL_TABLET | Freq: Three times a day (TID) | ORAL | 0 refills | Status: AC | PRN

## 2019-09-29 MED ORDER — ONDANSETRON HCL 4 MG/2ML IJ SOLN
4.0000 mg | Freq: Once | INTRAMUSCULAR | Status: AC
  Administered 2019-09-29: 4 mg via INTRAVENOUS
  Filled 2019-09-29: qty 2

## 2019-09-29 MED ORDER — POLYETHYLENE GLYCOL 3350 17 G PO PACK
17.0000 g | PACK | Freq: Every day | ORAL | Status: DC
  Filled 2019-09-29: qty 1

## 2019-09-29 MED ORDER — IOHEXOL 300 MG/ML  SOLN
100.0000 mL | Freq: Once | INTRAMUSCULAR | Status: AC | PRN
  Administered 2019-09-29: 100 mL via INTRAVENOUS

## 2019-09-29 MED ORDER — ACETAMINOPHEN 500 MG PO TABS
500.0000 mg | ORAL_TABLET | Freq: Once | ORAL | Status: AC
  Administered 2019-09-29: 500 mg via ORAL
  Filled 2019-09-29: qty 1

## 2019-09-29 MED ORDER — ONDANSETRON HCL 4 MG/2ML IJ SOLN
2.0000 mg | Freq: Once | INTRAMUSCULAR | Status: AC
  Administered 2019-09-29: 2 mg via INTRAVENOUS
  Filled 2019-09-29: qty 2

## 2019-09-29 NOTE — ED Notes (Signed)
Pt still nauseated after meds given AND STILL in pain , and  Dry heaving mom asking if we can give her meds that people  Have to prep for  Colonoscopy , to drink  explained that she is till nauseated and  Would likely vomit it all up, mom felt like pt had temp  It was 100.4 oral , Apolinar Junes PA made aware of all, ordered tylenol but agagin pt is still nauseated , requested Apolinar Junes to go in and speak to mom and pt

## 2019-09-29 NOTE — ED Triage Notes (Signed)
Intermittent Abd pain and bloationg in epigastric region for a couple months.  For the past couple days pt has not had a BM and she normally has one every day.  Pain is all across low abdomen now. Nausea but no vomiting.

## 2019-09-29 NOTE — Discharge Instructions (Signed)
At this time there does not appear to be the presence of an emergent medical condition, however there is always the potential for conditions to change. Please read and follow the below instructions.  Please return to the Emergency Department immediately for any new or worsening symptoms or if your symptoms do not improve within 2 days. Please be sure to follow up with your Primary Care Provider within one week regarding your visit today; please call their office to schedule an appointment even if you are feeling better for a follow-up visit. You may use the nausea medication Zofran as prescribed to help with your symptoms.  Please drink plenty of water to avoid dehydration and get enough rest.  You may use over-the-counter anti-inflammatories such as Tylenol and ibuprofen as directed on the packaging to help with your symptoms. You may use over-the-counter MiraLAX as prescribed to help with constipation.  Drinking water will also help with constipation.  Get help right away if: Your pain does not go away as soon as your doctor says it should. You cannot stop vomiting. Your pain is only in areas of your belly, such as the right side or the left lower part of the belly. You have bloody or black poop, or poop that looks like tar. You have very bad pain, cramping, or bloating in your belly. You have signs of not having enough fluid or water in your body (dehydration), such as: Dark pee, very little pee, or no pee. Cracked lips. Dry mouth. Sunken eyes. Sleepiness. Weakness. You have trouble breathing or chest pain. You have any new/concerning or worsening of symptoms  Please read the additional information packets attached to your discharge summary.  Do not take your medicine if  develop an itchy rash, swelling in your mouth or lips, or difficulty breathing; call 911 and seek immediate emergency medical attention if this occurs.  Note: Portions of this text may have been transcribed using voice  recognition software. Every effort was made to ensure accuracy; however, inadvertent computerized transcription errors may still be present.

## 2019-09-29 NOTE — ED Provider Notes (Signed)
[facility] EMERGENCY DEPARTMENT Provider Note   CSN: [id number] Arrival date & time: 09/29/19  0847     History Chief Complaint  Patient presents with  . Abdominal Pain    Dawn Webb is a 24 y.o. female presents to abdominal pain and decreased bowel movements.  Patient reports that she has not had a bowel movement in the past 3 days, she reports that she is still passing gas last time was that this morning.  She reports that she normally has a bowel movement every single day and that constipation is unusual for her she tried 1 suppository at home without relief.  She reports that this morning she developed abdominal pain initially in her upper abdomen as a "ball" pressure sensation which has since begun to radiate to her lower abdomen, pain is moderate intensity constant worsened with palpation no alleviating factors.  Associated symptom is nausea.  Patient reports her last p.o. intake was some pasta last night around 7 PM.  Denies fever/chills, chest pain/shortness of breath, vomiting, dysuria/hematuria, vaginal bleeding/discharge, fall/injury or any additional concerns. HPI     Past Medical History:  Diagnosis Date  . ADHD   . IC (interstitial cystitis)     There are no problems to display for this patient.   Past Surgical History:  Procedure Laterality Date  . TONSILLECTOMY  2013  . WISDOM TOOTH EXTRACTION       OB History    Gravida  0   Para      Term      Preterm      AB      Living        SAB      TAB      Ectopic      Multiple      Live Births              Family History  Problem Relation Age of Onset  . Lung cancer Maternal Grandfather        worked in [employer]  . Heart attack Father     Social History   Tobacco Use  . Smoking status: Never Smoker  . Smokeless tobacco: Never Used  Substance Use Topics  . Alcohol use: Yes    Alcohol/week: 0.0 standard drinks    Comment: weekly  . Drug use: No    Home  Medications Prior to Admission medications   Medication Sig Start Date End Date Taking? Authorizing Provider  etonogestrel-ethinyl estradiol (NUVARING) 0.12-0.015 MG/24HR vaginal ring INSERT 1 RING VAGINALLY AS DIRECTED. REMOVE AFTER 4 CONSECUTIVE WEEKS THEN REPLACE 02/16/18   Salvadore Dom, MD  ondansetron (ZOFRAN ODT) 4 MG disintegrating tablet Take 1 tablet (4 mg total) by mouth every 8 (eight) hours as needed for nausea or vomiting. 09/29/19   Deliah Boston, PA-C    Allergies    Patient has no known allergies.  Review of Systems   Review of Systems Ten systems are reviewed and are negative for acute change except as noted in the HPI  Physical Exam Updated Vital Signs BP 121/75 (BP Location: Left Arm)   Pulse 96   Temp (!) 100.4 F (38 C) (Oral)   Resp 16   Ht 5\' 3"  (1.6 m)   Wt 80.8 kg   LMP 09/21/2019   SpO2 98%   BMI 31.55 kg/m   Physical Exam Constitutional:      General: She is not in acute distress.    Appearance: Normal appearance. She  is well-developed. She is not ill-appearing or diaphoretic.  HENT:     Head: Normocephalic and atraumatic.     Right Ear: External ear normal.     Left Ear: External ear normal.     Nose: Nose normal.  Eyes:     General: Vision grossly intact. Gaze aligned appropriately.     Pupils: Pupils are equal, round, and reactive to light.  Neck:     Trachea: Trachea and phonation normal. No tracheal deviation.  Cardiovascular:     Rate and Rhythm: Normal rate and regular rhythm.  Pulmonary:     Effort: Pulmonary effort is normal. No respiratory distress.  Abdominal:     General: Bowel sounds are normal. There is no distension.     Palpations: Abdomen is soft.     Tenderness: There is abdominal tenderness in the right upper quadrant, right lower quadrant, periumbilical area and suprapubic area. There is no guarding or rebound.  Genitourinary:    Comments: Refused by patient Musculoskeletal:        General: Normal range of  motion.     Cervical back: Normal range of motion.  Skin:    General: Skin is warm and dry.  Neurological:     Mental Status: She is alert.     GCS: GCS eye subscore is 4. GCS verbal subscore is 5. GCS motor subscore is 6.     Comments: Speech is clear and goal oriented, follows commands Major Cranial nerves without deficit, no facial droop Moves extremities without ataxia, coordination intact  Psychiatric:        Behavior: Behavior normal.    ED Results / Procedures / Treatments   Labs (all labs ordered are listed, but only abnormal results are displayed) Labs Reviewed  CBC WITH DIFFERENTIAL/PLATELET - Abnormal; Notable for the following components:      Result Value   WBC 15.5 (*)    Neutro Abs 13.2 (*)    All other components within normal limits  COMPREHENSIVE METABOLIC PANEL - Abnormal; Notable for the following components:   Glucose, Bld 110 (*)    AST 13 (*)    All other components within normal limits  URINALYSIS, ROUTINE W REFLEX MICROSCOPIC - Abnormal; Notable for the following components:   APPearance HAZY (*)    pH 8.5 (*)    Hgb urine dipstick SMALL (*)    Protein, ur 30 (*)    Leukocytes,Ua SMALL (*)    All other components within normal limits  URINALYSIS, MICROSCOPIC (REFLEX) - Abnormal; Notable for the following components:   Bacteria, UA FEW (*)    All other components within normal limits  URINALYSIS, ROUTINE W REFLEX MICROSCOPIC - Abnormal; Notable for the following components:   Hgb urine dipstick MODERATE (*)    All other components within normal limits  URINALYSIS, MICROSCOPIC (REFLEX) - Abnormal; Notable for the following components:   Bacteria, UA RARE (*)    All other components within normal limits  SARS CORONAVIRUS 2 BY RT PCR (HOSPITAL ORDER, PERFORMED IN [facility])  URINE CULTURE  LIPASE, BLOOD  PREGNANCY, URINE    EKG None  Radiology DG Abdomen 1 View  Result Date: 09/29/2019 CLINICAL DATA:  Abdominal pain, nausea,  bloating, constipation EXAM: ABDOMEN - 1 VIEW COMPARISON:  None. FINDINGS: The bowel gas pattern is normal. Gas is present to the rectum. Moderate burden of stool predominantly in the right colon. No radio-opaque calculi or other significant radiographic abnormality are seen. IMPRESSION: Nonobstructive pattern of bowel  gas. Moderate burden of stool predominantly in the right colon. Electronically Signed   By: Lauralyn Primes M.D.   On: 09/29/2019 09:53   CT ABDOMEN PELVIS W CONTRAST  Result Date: 09/29/2019 CLINICAL DATA:  Epigastric abdominal pain for couple months. Now with lower abdominal pain. Clinical constipation. Nausea. EXAM: CT ABDOMEN AND PELVIS WITH CONTRAST TECHNIQUE: Multidetector CT imaging of the abdomen and pelvis was performed using the standard protocol following bolus administration of intravenous contrast. CONTRAST:  OMNIPAQUE IOHEXOL 300 MG/ML  SOLN COMPARISON:  Plain films of earlier today. FINDINGS: Lower chest: Clear lung bases. Normal heart size without pericardial or pleural effusion. Hepatobiliary: Focal steatosis adjacent the falciform ligament. Normal gallbladder, without biliary ductal dilatation. Pancreas: Normal, without mass or ductal dilatation. Spleen: Normal in size, without focal abnormality. Adrenals/Urinary Tract: Normal adrenal glands. Normal kidneys, without hydronephrosis. Normal urinary bladder. Stomach/Bowel: Normal stomach, without wall thickening. Colonic stool burden suggests constipation. Normal terminal ileum on 68/2. The appendix is normal including on coronal image 40 and sagittal image 63. Normal small bowel. Vascular/Lymphatic: Normal caliber of the aorta and branch vessels. No abdominopelvic adenopathy. Reproductive: Normal uterus and adnexa. Vaginal contraceptive ring. Other: Trace free pelvic fluid is likely physiologic. No free intraperitoneal air. Musculoskeletal: No acute osseous abnormality. IMPRESSION: 1. Possible constipation. 2. No other  explanation for patient's symptoms.  Normal appendix. Electronically Signed   By: Jeronimo Greaves M.D.   On: 09/29/2019 11:15   US Abdomen Limited RUQ  Result Date: 09/29/2019 CLINICAL DATA:  Patient with right upper quadrant abdominal pain and nausea. EXAM: ULTRASOUND ABDOMEN LIMITED RIGHT UPPER QUADRANT COMPARISON:  None. FINDINGS: Gallbladder: No gallstones or wall thickening visualized. No sonographic Murphy sign noted by sonographer. Common bile duct: Diameter: 3 mm Liver: No focal lesion identified. Within normal limits in parenchymal echogenicity. Portal vein is patent on color Doppler imaging with normal direction of blood flow towards the liver. Other: None. IMPRESSION: No cholelithiasis or sonographic evidence to suggest acute cholecystitis. Electronically Signed   By: Annia Belt M.D.   On: 09/29/2019 14:28    Procedures Procedures (including critical care time)  Medications Ordered in ED Medications  polyethylene glycol (MIRALAX / GLYCOLAX) packet 17 g (has no administration in time range)  ondansetron (ZOFRAN) injection 4 mg (4 mg Intravenous Given 09/29/19 0929)  morphine 4 MG/ML injection 4 mg (4 mg Intravenous Given 09/29/19 0931)  sodium chloride 0.9 % bolus 500 mL (0 mLs Intravenous Stopped 09/29/19 1038)  iohexol (OMNIPAQUE) 300 MG/ML solution 100 mL (100 mLs Intravenous Contrast Given 09/29/19 1052)  ondansetron (ZOFRAN) injection 2 mg (2 mg Intravenous Given 09/29/19 1238)  morphine 2 MG/ML injection 2 mg (2 mg Intravenous Given 09/29/19 1239)  acetaminophen (TYLENOL) tablet 500 mg (500 mg Oral Given 09/29/19 1428)    ED Course  I have reviewed the triage vital signs and the nursing notes.  Pertinent labs & imaging results that were available during my care of the patient were reviewed by me and considered in my medical decision making (see chart for details).    MDM Rules/Calculators/A&P                     Additional History Obtained: 1. Nursing notes from this  visit. 2. Patient's mother at bedside. ------------------ 24 year old female who arrives for 1 day of abdominal pain and 3 days without bowel movement.  Associated with nausea.  Denies fever.  Exam shows that he is well-appearing in no acute distress.  She has  generalized abdominal tenderness worse in the right side of the abdomen compared to left.  Patient and mother concerned for possible SBO.  Patient is still passing gas and has bowel sounds.  Shared decision-making was made with patient, her mother and myself, plan of care is to obtain abdominal labs including CBC, CMP, lipase, urinalysis and urine pregnancy test as well as a abdominal x-ray to assess for bowel gas pattern.  They wish to wait on CT scan for now.  Zofran and morphine ordered for nausea and pain.  Vital signs stable. - I have reviewed and interpreted the following labs which include:  CBC which shows leukocytosis of 15.5 with left shift. CMP which shows no emergent electrolyte derangement, acute kidney injury or acute elevation of LFTs. Lipase within normal limits, no evidence of pancreatitis. Urine pregnancy test negative. Urinalysis shows hemoglobin, protein, small leukocytes, 21-50 WBCs, few bacteria, 11-20 squamous cells.  Nitrite negative.  Patient has no urinary symptoms.  Suspect urinalysis is secondary to contamination and interstitial cystitis, she has no urinary symptoms will send for culture.  Doubt UTI as cause of patient's symptoms.  DG abdomen:  IMPRESSION:  Nonobstructive pattern of bowel gas. Moderate burden of stool  predominantly in the right colon.  - Given patient's right-sided symptoms and elevated white blood cell count possible concern for appendicitis.  Shared decision-making was made with patient and her mother, they are agreeable for CT abdomen pelvis for further assessment.  Nausea and pain improved following morphine and Zofran.  We will continue to monitor.  CT AP:  IMPRESSION:  1. Possible  constipation.  2. No other explanation for patient's symptoms. Normal appendix.  - Patient reassessed, well-appearing no acute distress.  They have been updated on findings as above and states understanding.  Possible of patient's symptoms today may be secondary to her constipation seen on x-ray and CT scan today.  Plan of care will be discharged with increased water intake and MiraLAX.  They are requesting discharge.  At this point I again asked patient about her urinary symptoms and she again denies any urinary symptoms.  I then asked patient's mother to step out of the room, with chaperone Aram BeechamCynthia RN I discussed and offered pelvic examination today for evaluation of pelvic etiology of her symptoms such as PID or torsion, patient declined this.  For second time she denied any concern of STI, vaginal discharge vaginal bleeding or any additional concerns.  She is requesting discharge, will prescribe medications and encourage PCP follow-up. - Informed by RN that patient's pain and nausea have returned.  Additional morphine and Zofran ordered ordered.  I was then informed that patient is now febrile with temperature of 100.4 F.  She has no elevation of her LFTs, Tylenol ordered. - Discussed case with Dr. Criss AlvineGoldston, will obtain a RUQ US and repeat urinalysis. - RUQ ultrasound:    IMPRESSION:  No cholelithiasis or sonographic evidence to suggest acute  cholecystitis.   Second urinalysis: Shows moderate hemoglobin, rare bacteria otherwise within normal limits.  Patient with no urinary symptoms doubt UTI.  Additionally no evidence of kidney stone on CT scan.  Covid test is negative. ------------------- Suspect patient may be experiencing a viral gastroenteritis today as causes her abdominal pain nausea and vomiting.  There is no evidence of bacterial infection requiring antibiotics at this point.  Symptoms improved following second dose of pain and nausea medication.  Shared decision-making was made  with patient, her mother and myself, they are requesting discharge with nausea  medication and will follow up with PCP.  We discussed return precautions extensively and they stated understanding.  Additionally patient will use OTC MiraLAX after her symptoms improved to help with constipation. There is no indication for further ED work-up.  Patient was seen and evaluated by Dr. Criss Alvine during this visit who agrees with plan of discharge with antiemetics and outpatient follow-up.   At this time there does not appear to be any evidence of an acute emergency medical condition and the patient appears stable for discharge with appropriate outpatient follow up. Diagnosis was discussed with patient who verbalizes understanding of care plan and is agreeable to discharge. I have discussed return precautions with patient and mother who verbalizes understanding. Patient encouraged to follow-up with their PCP. All questions answered.  Note: Portions of this report may have been transcribed using voice recognition software. Every effort was made to ensure accuracy; however, inadvertent computerized transcription errors may still be present. Final Clinical Impression(s) / ED Diagnoses Final diagnoses:  Abdominal pain  Viral gastroenteritis    Rx / DC Orders ED Discharge Orders         Ordered    ondansetron (ZOFRAN ODT) 4 MG disintegrating tablet  Every 8 hours PRN     09/29/19 1503           Elizabeth Palau 09/29/19 1513    Pricilla Loveless, MD 09/30/19 [phone]

## 2019-09-30 LAB — URINE CULTURE

## 2021-10-23 IMAGING — US US ABDOMEN LIMITED
1 series · 14 of 25 positions shown · non-contrast
Comparison: None.

CLINICAL DATA: Patient with right upper quadrant abdominal pain and
nausea.

EXAM:
ULTRASOUND ABDOMEN LIMITED RIGHT UPPER QUADRANT

[Series 1: us abdomen limited · 14 of 31 slices shown]
[im 1/31]
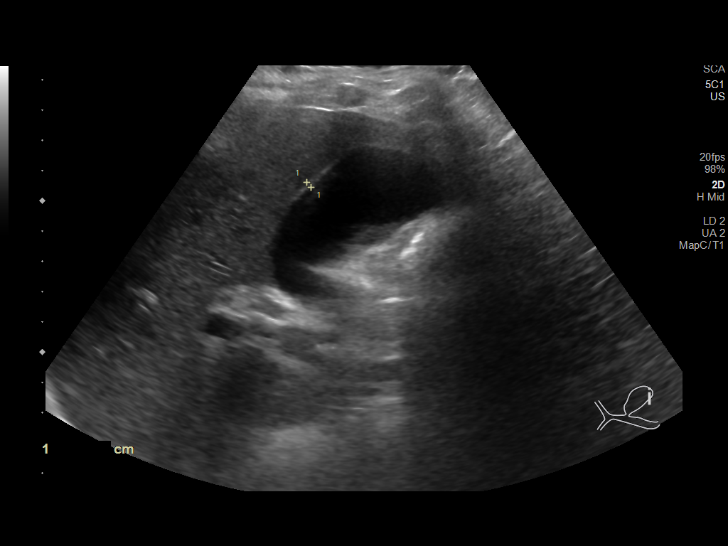
[im 3/31]
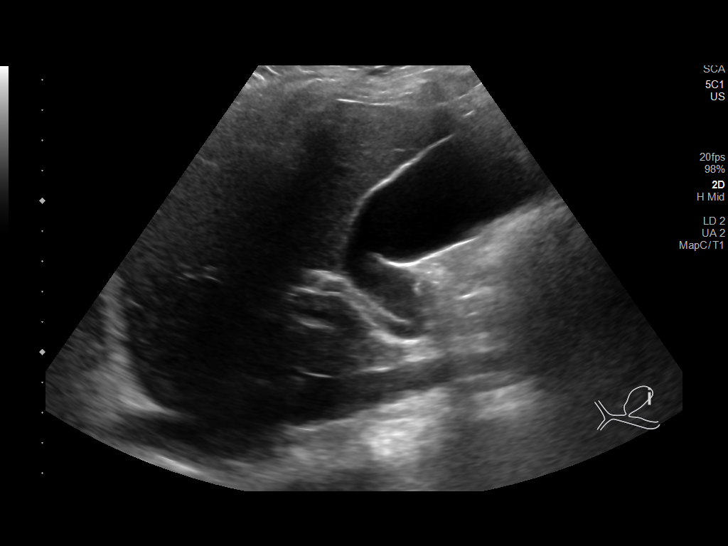
[im 6/31]
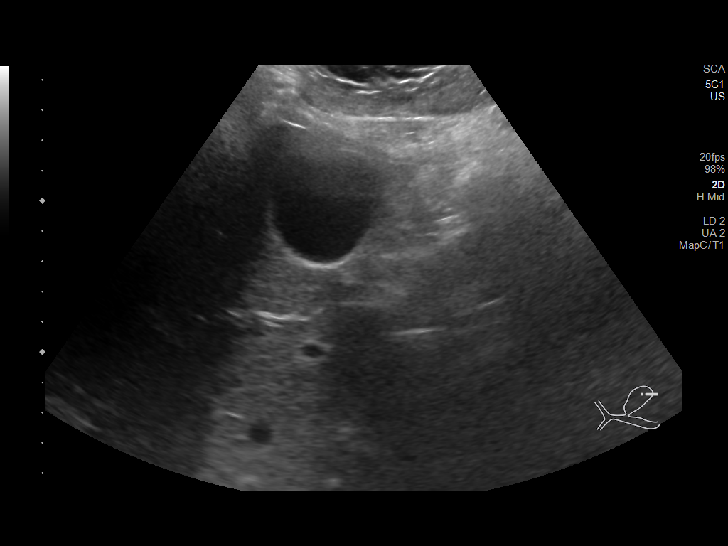
[im 8/31]
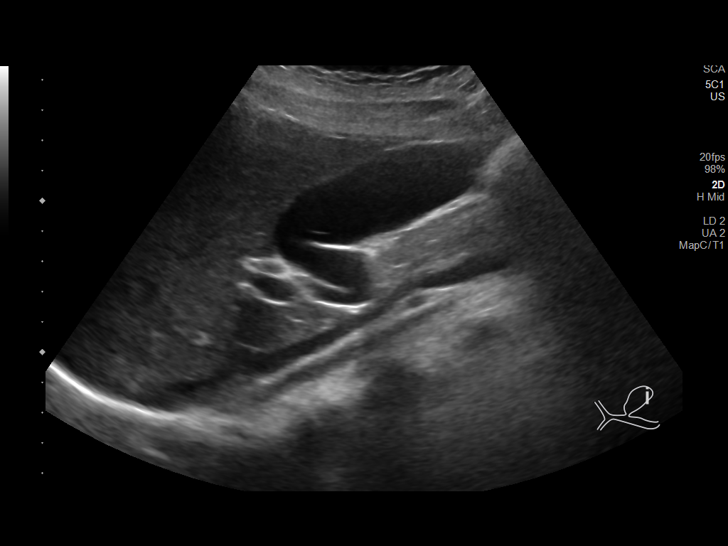
[im 11/31]
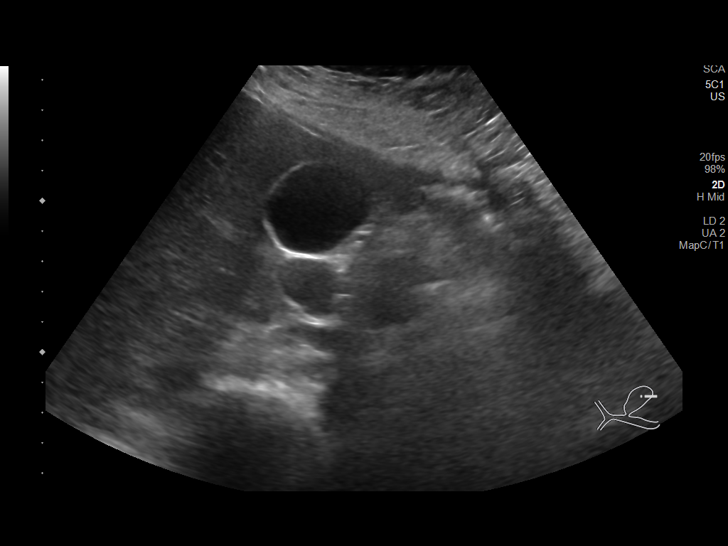
[im 12/31]
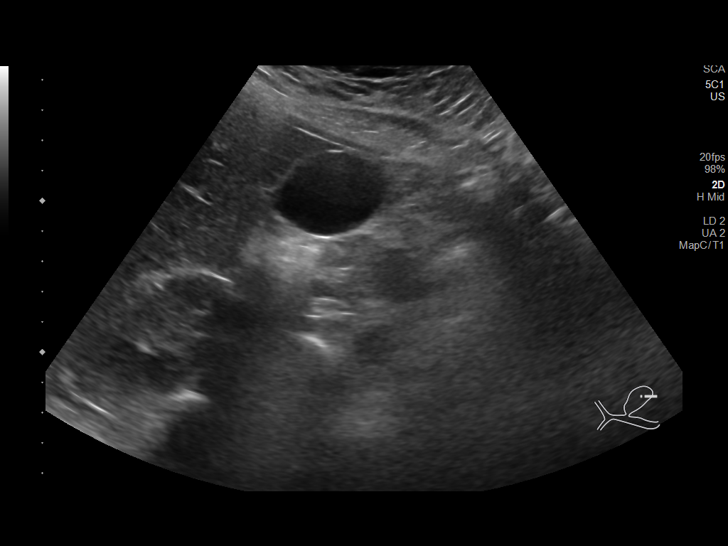
[im 14/31]
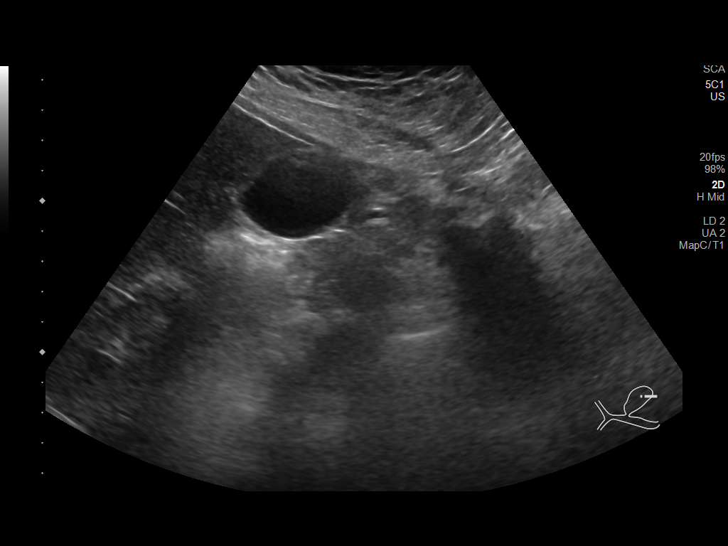
[im 17/31]
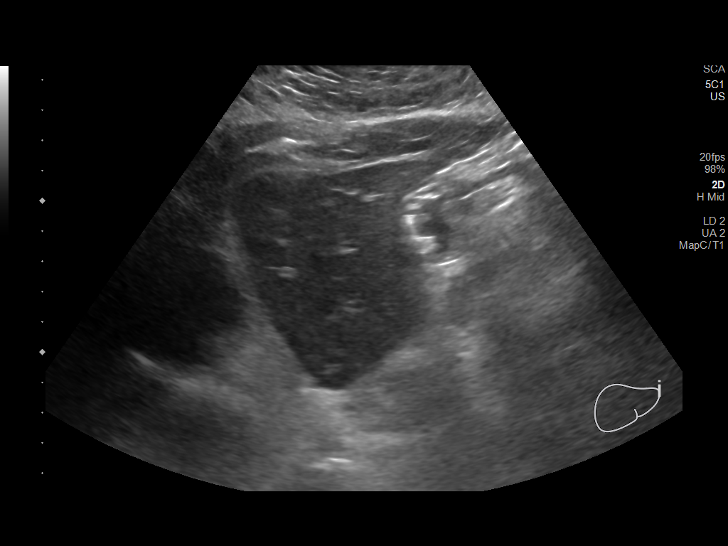
[im 19/31]
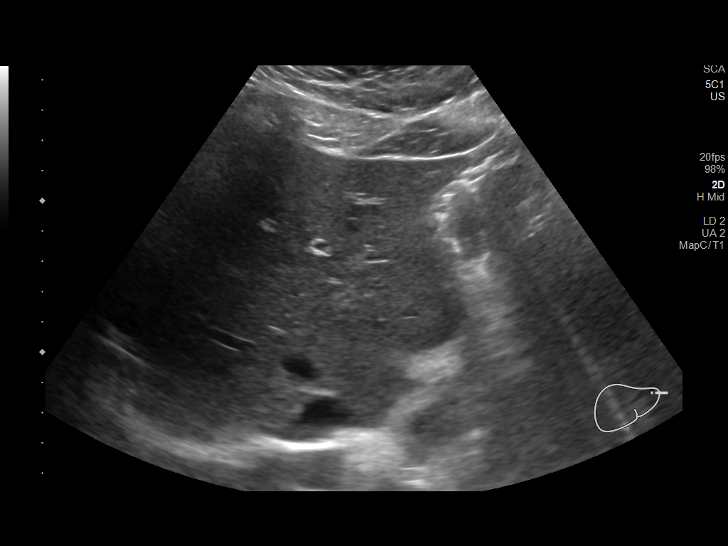
[im 21/31]
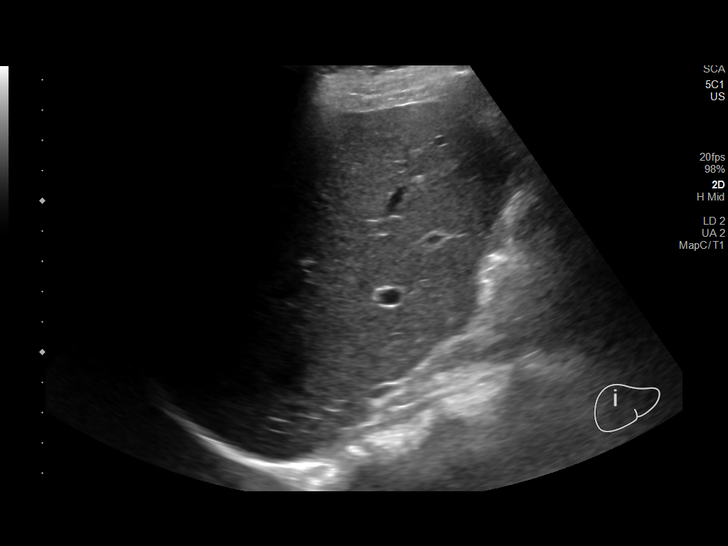
[im 23/31]
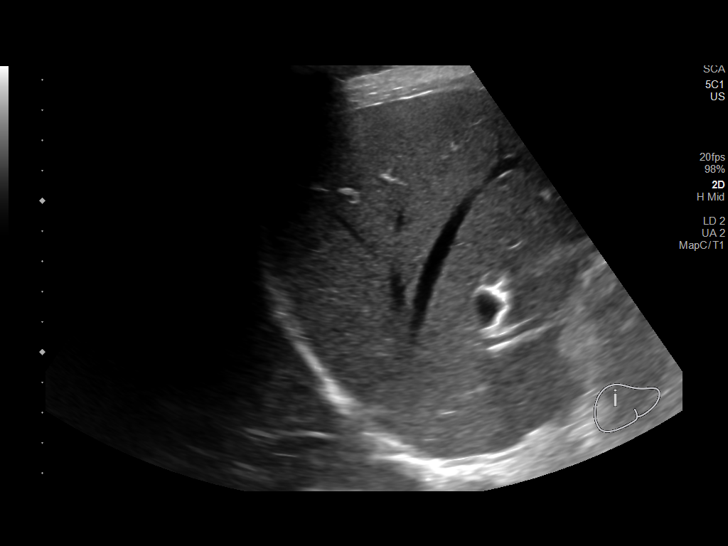
[im 26/31]
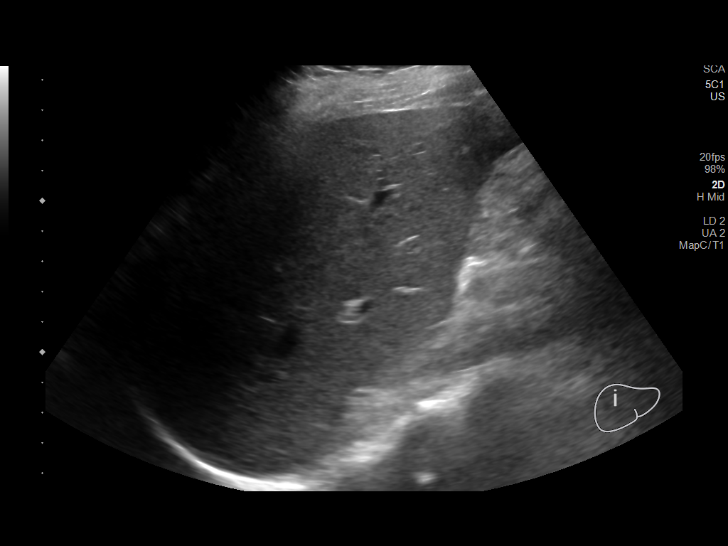
[im 28/31]
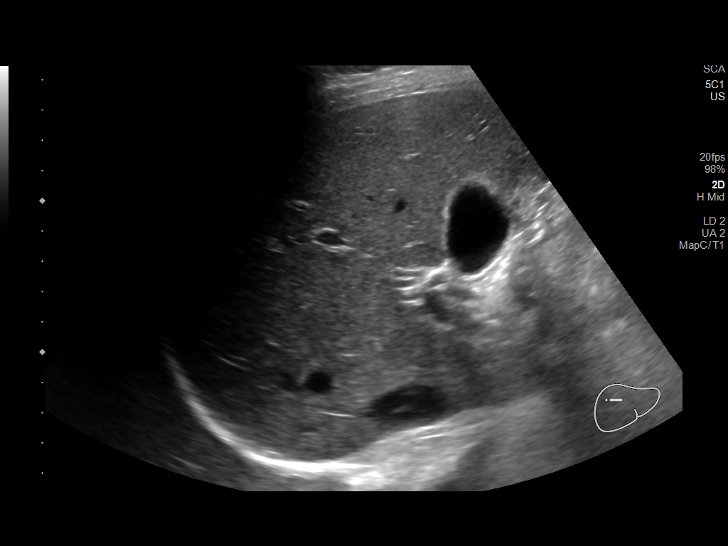
[im 31/31]
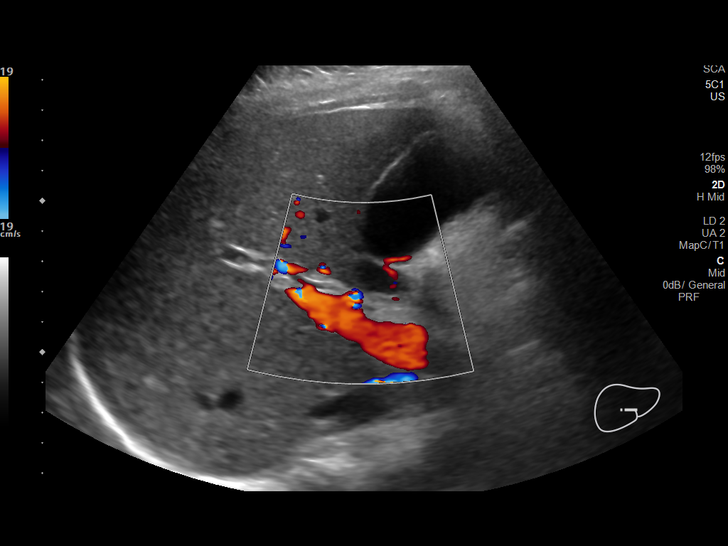

[14 of 25 positions shown; findings below may reference images not displayed]

FINDINGS: Gallbladder:

No gallstones or wall thickening visualized. No sonographic Murphy
sign noted by sonographer.

Common bile duct:

Diameter: 3 mm

Liver:

No focal lesion identified. Within normal limits in parenchymal
echogenicity. Portal vein is patent on color Doppler imaging with
normal direction of blood flow towards the liver.

Other: None.
IMPRESSION: No cholelithiasis or sonographic evidence to suggest acute
cholecystitis.

## 2021-10-23 IMAGING — CT CT ABD-PELV W/ CM
2 of 4 series · 16 of 46 positions shown, 18 images · IV contrast (Omnipaque)
Comparison: Plain films of earlier today.

CLINICAL DATA: Epigastric abdominal pain for couple months. Now
with lower abdominal pain. Clinical constipation. Nausea.

EXAM:
CT ABDOMEN AND PELVIS WITH CONTRAST
TECHNIQUE: Multidetector CT imaging of the abdomen and pelvis was performed
using the standard protocol following bolus administration of
intravenous contrast.
CONTRAST:  100mL OMNIPAQUE IOHEXOL 300 MG/ML  SOLN

[Series 2: axial st · axial · 0.85mm/px · z∈[-476,-36]mm · 13 of 98 slices shown, 15 images]
[im 5/98  soft-tissue]
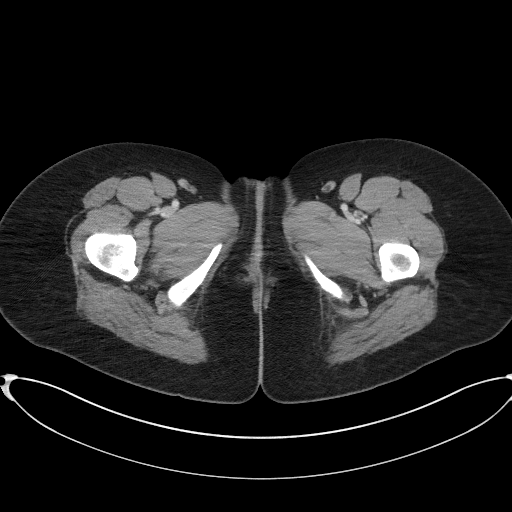
[im 5/98  bone]
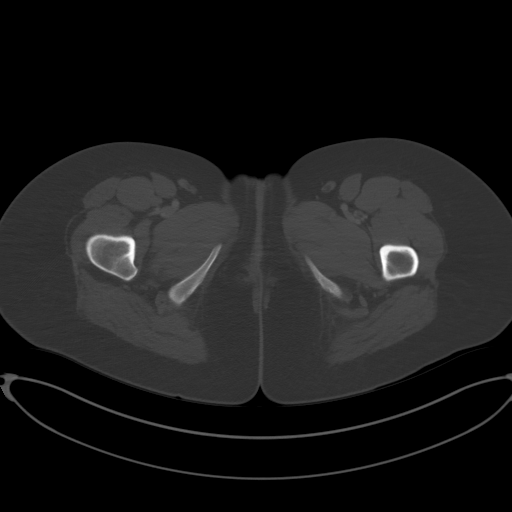
[im 13/98  soft-tissue]
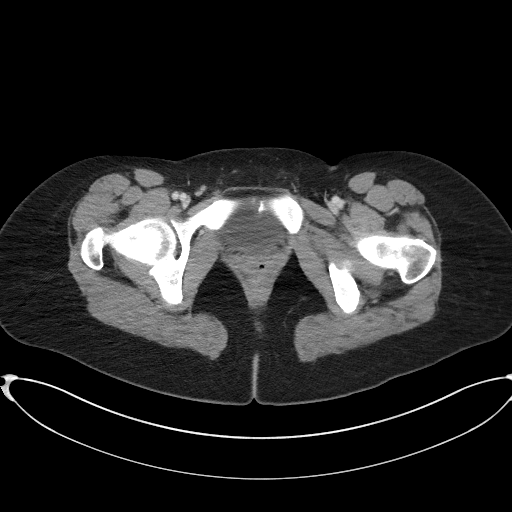
[im 22/98  soft-tissue]
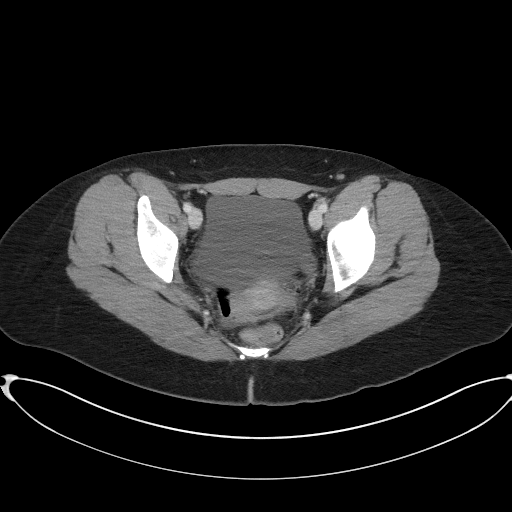
[im 26/98  soft-tissue]
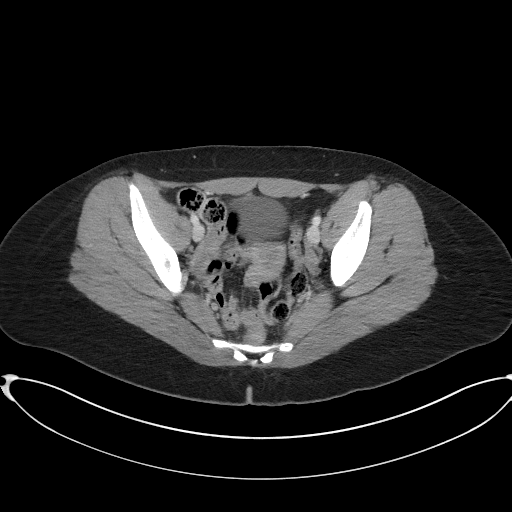
[im 34/98  soft-tissue]
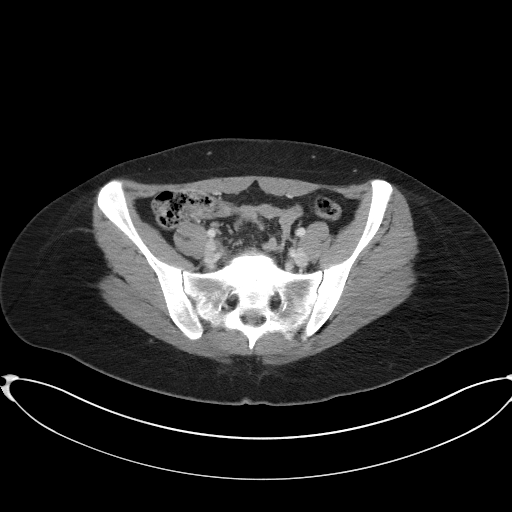
[im 43/98  soft-tissue]
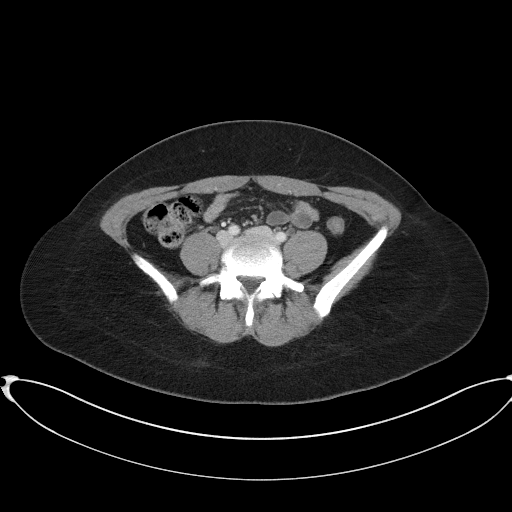
[im 51/98  soft-tissue]
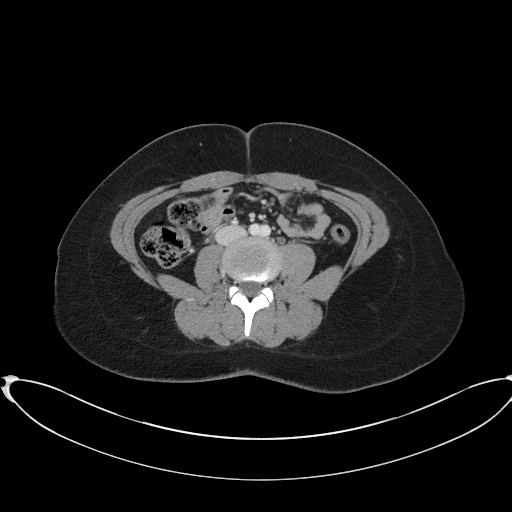
[im 55/98  soft-tissue]
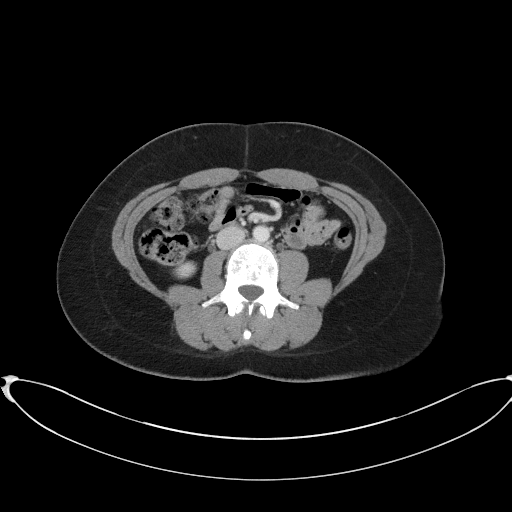
[im 64/98  soft-tissue]
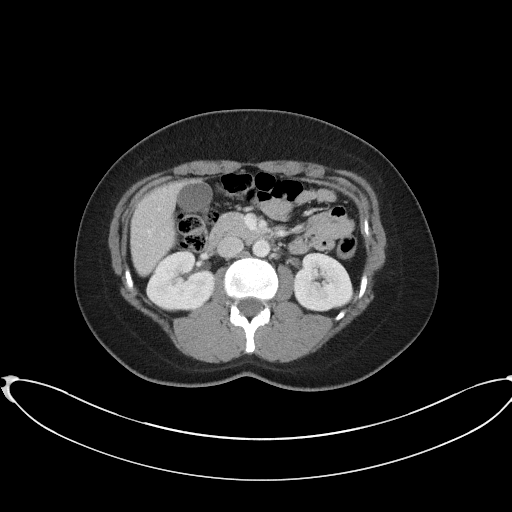
[im 64/98  bone]
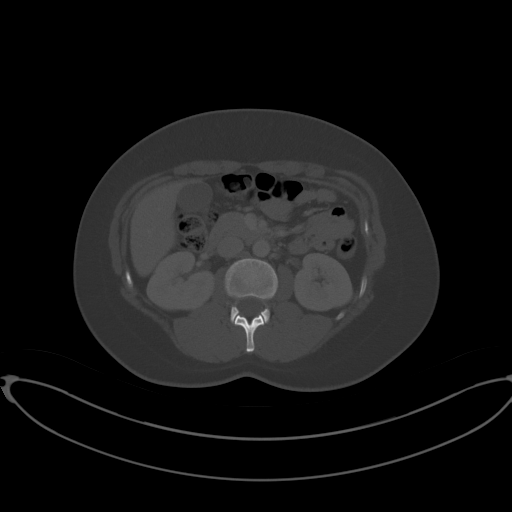
[im 72/98  soft-tissue]
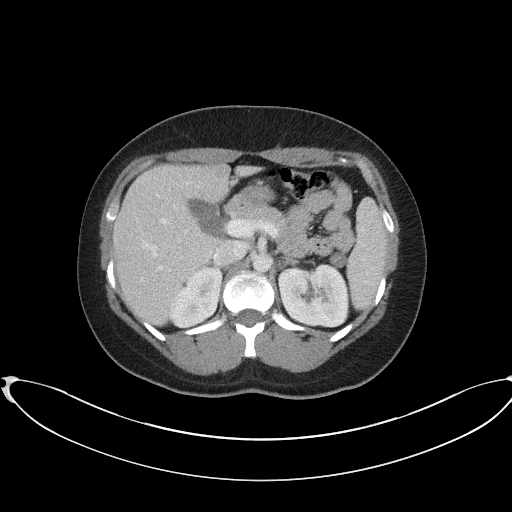
[im 76/98  soft-tissue]
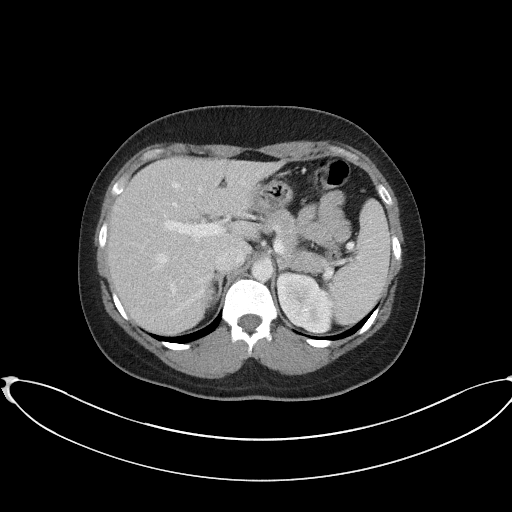
[im 85/98  soft-tissue]
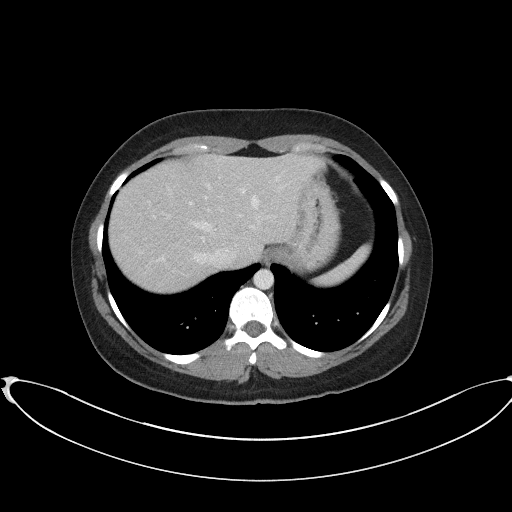
[im 93/98  soft-tissue]
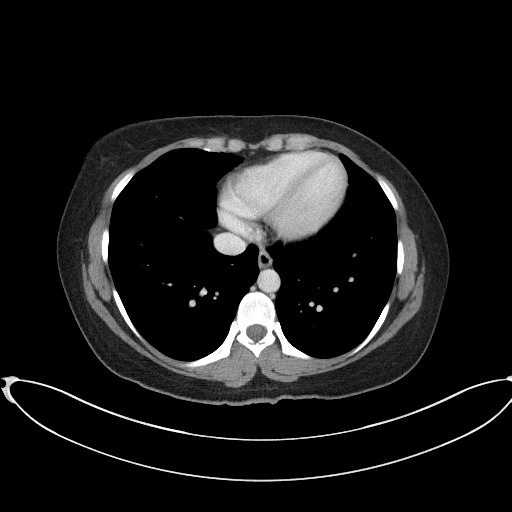

[Series 5: coronal st · coronal · 0.84mm/px · 3 of 86 slices shown]
[im 29/86  soft-tissue]
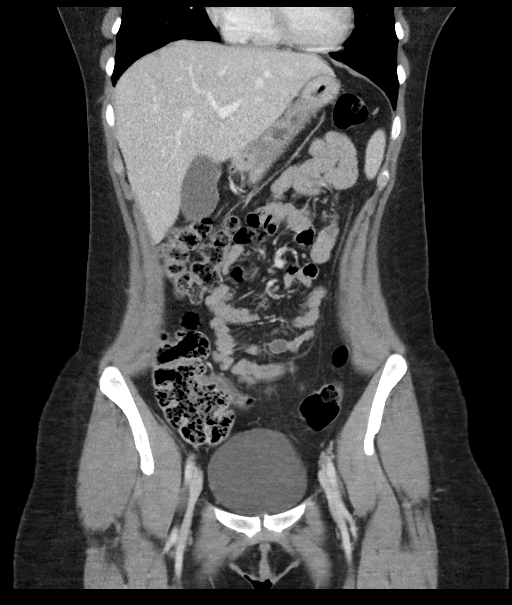
[im 38/86  soft-tissue]
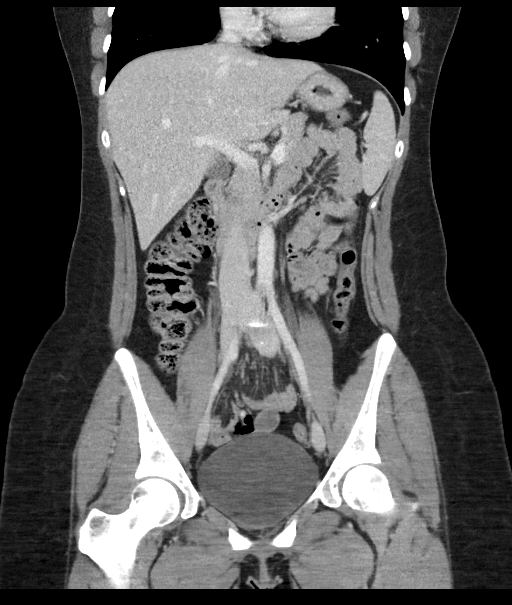
[im 48/86  soft-tissue]
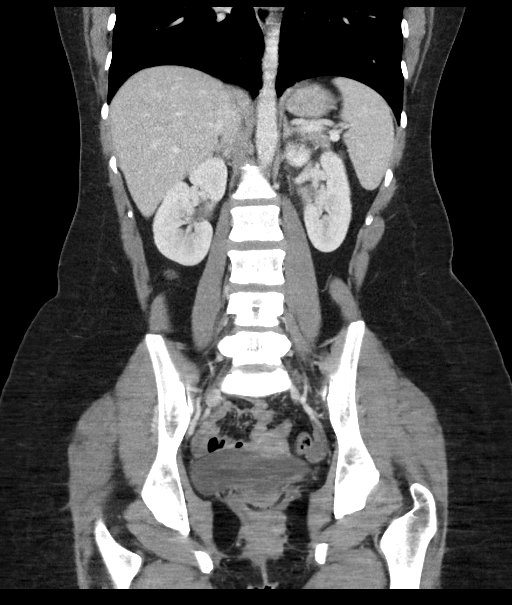

[16 of 46 positions shown; findings below may reference images not displayed]

FINDINGS: Lower chest: Clear lung bases. Normal heart size without pericardial
or pleural effusion.

Hepatobiliary: Focal steatosis adjacent the falciform ligament.
Normal gallbladder, without biliary ductal dilatation.

Pancreas: Normal, without mass or ductal dilatation.

Spleen: Normal in size, without focal abnormality.

Adrenals/Urinary Tract: Normal adrenal glands. Normal kidneys,
without hydronephrosis. Normal urinary bladder.

Stomach/Bowel: Normal stomach, without wall thickening. Colonic
stool burden suggests constipation. Normal terminal ileum on 68/2.
The appendix is normal including on coronal image 40 and sagittal
image 63.

Normal small bowel.

Vascular/Lymphatic: Normal caliber of the aorta and branch vessels.
No abdominopelvic adenopathy.

Reproductive: Normal uterus and adnexa. Vaginal contraceptive ring.

Other: Trace free pelvic fluid is likely physiologic. No free
intraperitoneal air.

Musculoskeletal: No acute osseous abnormality.
IMPRESSION: 1. Possible constipation.
2. No other explanation for patient's symptoms.  Normal appendix.
